# Patient Record
Sex: Female | Born: 1960 | Race: White | Hispanic: No | Marital: Single | Smoking: Former smoker
Health system: Southern US, Community
[De-identification: ages and names within clinical notes are randomized; demographics above are authoritative.]

## PROBLEM LIST (undated history)

## (undated) HISTORY — PX: OTHER SURGICAL HISTORY: SHX169

---

## 1998-01-13 ENCOUNTER — Other Ambulatory Visit: Admission: RE | Admit: 1998-01-13 | Discharge: 1998-01-13 | Payer: Self-pay | Admitting: *Deleted

## 1999-07-21 ENCOUNTER — Other Ambulatory Visit: Admission: RE | Admit: 1999-07-21 | Discharge: 1999-07-21 | Payer: Self-pay | Admitting: Obstetrics and Gynecology

## 2001-08-07 ENCOUNTER — Other Ambulatory Visit: Admission: RE | Admit: 2001-08-07 | Discharge: 2001-08-07 | Payer: Self-pay | Admitting: Obstetrics and Gynecology

## 2001-08-07 ENCOUNTER — Other Ambulatory Visit: Admission: RE | Admit: 2001-08-07 | Discharge: 2001-08-07 | Payer: Self-pay | Admitting: Obstetrics & Gynecology

## 2002-10-22 ENCOUNTER — Other Ambulatory Visit: Admission: RE | Admit: 2002-10-22 | Discharge: 2002-10-22 | Payer: Self-pay | Admitting: Obstetrics and Gynecology

## 2006-08-05 ENCOUNTER — Encounter: Admission: RE | Admit: 2006-08-05 | Discharge: 2006-08-05 | Payer: Self-pay | Admitting: Obstetrics and Gynecology

## 2007-08-18 ENCOUNTER — Encounter: Admission: RE | Admit: 2007-08-18 | Discharge: 2007-08-18 | Payer: Self-pay | Admitting: Obstetrics and Gynecology

## 2007-10-01 ENCOUNTER — Emergency Department (HOSPITAL_COMMUNITY): Admission: EM | Admit: 2007-10-01 | Discharge: 2007-10-01 | Payer: Self-pay | Admitting: Family Medicine

## 2009-09-26 ENCOUNTER — Encounter: Admission: RE | Admit: 2009-09-26 | Discharge: 2009-09-26 | Payer: Self-pay | Admitting: Obstetrics and Gynecology

## 2010-08-10 ENCOUNTER — Ambulatory Visit (HOSPITAL_BASED_OUTPATIENT_CLINIC_OR_DEPARTMENT_OTHER)
Admission: RE | Admit: 2010-08-10 | Discharge: 2010-08-10 | Disposition: A | Discharge status: Home / Self Care | Payer: BC Managed Care – PPO | Source: Ambulatory Visit | Attending: Otolaryngology | Admitting: Otolaryngology

## 2010-08-10 ENCOUNTER — Other Ambulatory Visit: Payer: Self-pay | Admitting: Otolaryngology

## 2010-08-10 DIAGNOSIS — K219 Gastro-esophageal reflux disease without esophagitis: Secondary | ICD-10-CM | POA: Insufficient documentation

## 2010-08-10 DIAGNOSIS — E669 Obesity, unspecified: Secondary | ICD-10-CM | POA: Insufficient documentation

## 2010-08-10 DIAGNOSIS — F17200 Nicotine dependence, unspecified, uncomplicated: Secondary | ICD-10-CM | POA: Insufficient documentation

## 2010-08-10 DIAGNOSIS — J383 Other diseases of vocal cords: Secondary | ICD-10-CM | POA: Insufficient documentation

## 2010-08-10 LAB — POCT HEMOGLOBIN-HEMACUE: Hemoglobin: 13.5 g/dL (ref 12.0–15.0)

## 2010-09-10 NOTE — Op Note (Signed)
  [place], [place]             ACCOUNT NO.:  [id number]  MEDICAL RECORD NO.:  [id number]  LOCATION:                                 FACILITY:  PHYSICIAN:  Shermon Bozzi H. Pollyann Kennedy, MD          DATE OF BIRTH:  DATE OF PROCEDURE:  08/10/2010 DATE OF DISCHARGE:                              OPERATIVE REPORT   PREOPERATIVE DIAGNOSIS:  Left vocal cord leukoplakia.  POSTOPERATIVE DIAGNOSIS:  Left vocal cord leukoplakia.  PROCEDURE:  Microlaryngoscopy with biopsy and laser ablation of left vocal cord leukoplakia.  SURGEON:  Myrakle Wingler H. Pollyann Kennedy, MD  ANESTHESIA:  General endotracheal anesthesia was used.  COMPLICATIONS:  None.  BLOOD LOSS:  None.  FINDINGS:  Very subtle thickening of the left true vocal cord contacting surface.  No ulceration and no bulky mass identified.  HISTORY:  A 50 year old with a history of chronic reflux and hoarseness. She was found on serial office endoscopic exams to have faint superficial leukoplakia of the left vocal cord.  Most of her symptoms have resolved with aggressive reflux treatment but the laryngoscopic findings have not.  Risks, benefits, alternatives, complications to the procedure were explained to the patient, seemed to understand and agreed to surgery.  PROCEDURE IN DETAILS:  The patient was taken to the operating room, placed on the operating room table in supine position.  Following induction of general endotracheal anesthesia, the table was turned and the patient was draped in a standard fashion.  Moist gauze was used to protect the eyes.  Wet towels were then placed around the face.  A laser Gerilyn Pilgrim laryngoscope was entered into the oral cavity, used to view the larynx and suspended the Mayo stand with the suspension apparatus.  The microscope was brought into view.  The area was inspected.  A small biopsy forceps was used to take a sample of the membranous fold mucosa on the left.  This was sent for pathologic evaluation.  The CO2 laser was  then attached to the microscope with a setting of 1 watt continuous power was used to ablate the abnormal surface mucosa.  Char was carefully suctioned off.  There was no bleeding.  There was no disruption of the muscle or ligaments.  The laser was performed using apneic technique.  The tube was then replaced.  The scope was removed. The patient was awakened, extubated and transferred to recovery room in stable condition.     Brynlie Daza H. Pollyann Kennedy, MD     JHR/MEDQ  D:  08/10/2010  T:  08/10/2010  Job:  [id number]  Electronically Signed by Serena Colonel MD on 09/10/2010 01:07:34 PM

## 2010-11-23 ENCOUNTER — Other Ambulatory Visit: Payer: Self-pay | Admitting: Obstetrics & Gynecology

## 2010-11-23 DIAGNOSIS — Z1231 Encounter for screening mammogram for malignant neoplasm of breast: Secondary | ICD-10-CM

## 2010-12-09 ENCOUNTER — Ambulatory Visit
Admission: RE | Admit: 2010-12-09 | Discharge: 2010-12-09 | Disposition: A | Discharge status: Home / Self Care | Payer: BC Managed Care – PPO | Source: Ambulatory Visit | Attending: Obstetrics & Gynecology | Admitting: Obstetrics & Gynecology

## 2010-12-09 DIAGNOSIS — Z1231 Encounter for screening mammogram for malignant neoplasm of breast: Secondary | ICD-10-CM

## 2012-02-21 ENCOUNTER — Other Ambulatory Visit: Payer: Self-pay | Admitting: Obstetrics & Gynecology

## 2012-02-21 DIAGNOSIS — Z1231 Encounter for screening mammogram for malignant neoplasm of breast: Secondary | ICD-10-CM

## 2012-03-23 ENCOUNTER — Ambulatory Visit
Admission: RE | Admit: 2012-03-23 | Discharge: 2012-03-23 | Disposition: A | Discharge status: Home / Self Care | Payer: 59 | Source: Ambulatory Visit | Attending: Obstetrics & Gynecology | Admitting: Obstetrics & Gynecology

## 2012-03-23 DIAGNOSIS — Z1231 Encounter for screening mammogram for malignant neoplasm of breast: Secondary | ICD-10-CM

## 2012-06-07 ENCOUNTER — Other Ambulatory Visit: Payer: Self-pay | Admitting: Physician Assistant

## 2012-06-07 ENCOUNTER — Ambulatory Visit
Admission: RE | Admit: 2012-06-07 | Discharge: 2012-06-07 | Disposition: A | Discharge status: Home / Self Care | Payer: 59 | Source: Ambulatory Visit | Attending: Physician Assistant | Admitting: Physician Assistant

## 2012-06-07 DIAGNOSIS — M79671 Pain in right foot: Secondary | ICD-10-CM

## 2013-04-30 ENCOUNTER — Other Ambulatory Visit: Payer: Self-pay

## 2013-04-30 DIAGNOSIS — Z1231 Encounter for screening mammogram for malignant neoplasm of breast: Secondary | ICD-10-CM

## 2013-05-11 ENCOUNTER — Encounter (INDEPENDENT_AMBULATORY_CARE_PROVIDER_SITE_OTHER): Payer: Self-pay

## 2013-05-11 ENCOUNTER — Ambulatory Visit
Admission: RE | Admit: 2013-05-11 | Discharge: 2013-05-11 | Disposition: A | Discharge status: Home / Self Care | Payer: 59 | Source: Ambulatory Visit

## 2013-05-11 DIAGNOSIS — Z1231 Encounter for screening mammogram for malignant neoplasm of breast: Secondary | ICD-10-CM

## 2014-04-11 ENCOUNTER — Other Ambulatory Visit: Payer: Self-pay

## 2014-04-11 DIAGNOSIS — Z1231 Encounter for screening mammogram for malignant neoplasm of breast: Secondary | ICD-10-CM

## 2014-06-21 ENCOUNTER — Ambulatory Visit
Admission: RE | Admit: 2014-06-21 | Discharge: 2014-06-21 | Disposition: A | Discharge status: Home / Self Care | Payer: 59 | Source: Ambulatory Visit

## 2014-06-21 DIAGNOSIS — Z1231 Encounter for screening mammogram for malignant neoplasm of breast: Secondary | ICD-10-CM

## 2014-11-08 ENCOUNTER — Ambulatory Visit
Admission: RE | Admit: 2014-11-08 | Discharge: 2014-11-08 | Disposition: A | Discharge status: Home / Self Care | Payer: 59 | Source: Ambulatory Visit | Attending: Family Medicine | Admitting: Family Medicine

## 2014-11-08 ENCOUNTER — Other Ambulatory Visit: Payer: Self-pay | Admitting: Family Medicine

## 2014-11-08 DIAGNOSIS — M542 Cervicalgia: Secondary | ICD-10-CM

## 2015-07-08 ENCOUNTER — Other Ambulatory Visit: Payer: Self-pay | Admitting: Obstetrics & Gynecology

## 2015-07-08 DIAGNOSIS — Z1231 Encounter for screening mammogram for malignant neoplasm of breast: Secondary | ICD-10-CM

## 2015-07-18 ENCOUNTER — Ambulatory Visit
Admission: RE | Admit: 2015-07-18 | Discharge: 2015-07-18 | Disposition: A | Discharge status: Home / Self Care | Payer: 59 | Source: Ambulatory Visit | Attending: Obstetrics & Gynecology | Admitting: Obstetrics & Gynecology

## 2015-07-18 DIAGNOSIS — Z1231 Encounter for screening mammogram for malignant neoplasm of breast: Secondary | ICD-10-CM

## 2016-06-16 DIAGNOSIS — F5101 Primary insomnia: Secondary | ICD-10-CM | POA: Diagnosis not present

## 2016-06-16 DIAGNOSIS — K219 Gastro-esophageal reflux disease without esophagitis: Secondary | ICD-10-CM | POA: Diagnosis not present

## 2016-08-25 ENCOUNTER — Other Ambulatory Visit: Payer: Self-pay | Admitting: Obstetrics & Gynecology

## 2016-08-25 DIAGNOSIS — Z1231 Encounter for screening mammogram for malignant neoplasm of breast: Secondary | ICD-10-CM

## 2016-09-03 ENCOUNTER — Ambulatory Visit
Admission: RE | Admit: 2016-09-03 | Discharge: 2016-09-03 | Disposition: A | Discharge status: Home / Self Care | Payer: 59 | Source: Ambulatory Visit | Attending: Obstetrics & Gynecology | Admitting: Obstetrics & Gynecology

## 2016-09-03 DIAGNOSIS — Z1231 Encounter for screening mammogram for malignant neoplasm of breast: Secondary | ICD-10-CM | POA: Diagnosis not present

## 2016-10-13 DIAGNOSIS — K5730 Diverticulosis of large intestine without perforation or abscess without bleeding: Secondary | ICD-10-CM | POA: Diagnosis not present

## 2016-10-13 DIAGNOSIS — Z1211 Encounter for screening for malignant neoplasm of colon: Secondary | ICD-10-CM | POA: Diagnosis not present

## 2016-10-13 DIAGNOSIS — K648 Other hemorrhoids: Secondary | ICD-10-CM | POA: Diagnosis not present

## 2016-10-18 DIAGNOSIS — Z23 Encounter for immunization: Secondary | ICD-10-CM | POA: Diagnosis not present

## 2016-10-18 DIAGNOSIS — Z1159 Encounter for screening for other viral diseases: Secondary | ICD-10-CM | POA: Diagnosis not present

## 2016-10-18 DIAGNOSIS — Z0000 Encounter for general adult medical examination without abnormal findings: Secondary | ICD-10-CM | POA: Diagnosis not present

## 2016-11-05 DIAGNOSIS — D2261 Melanocytic nevi of right upper limb, including shoulder: Secondary | ICD-10-CM | POA: Diagnosis not present

## 2016-11-05 DIAGNOSIS — D2222 Melanocytic nevi of left ear and external auricular canal: Secondary | ICD-10-CM | POA: Diagnosis not present

## 2016-11-05 DIAGNOSIS — L821 Other seborrheic keratosis: Secondary | ICD-10-CM | POA: Diagnosis not present

## 2016-11-26 DIAGNOSIS — Z01419 Encounter for gynecological examination (general) (routine) without abnormal findings: Secondary | ICD-10-CM | POA: Diagnosis not present

## 2017-08-22 ENCOUNTER — Other Ambulatory Visit: Payer: Self-pay | Admitting: Obstetrics & Gynecology

## 2017-08-22 DIAGNOSIS — Z1231 Encounter for screening mammogram for malignant neoplasm of breast: Secondary | ICD-10-CM

## 2017-10-03 DIAGNOSIS — K219 Gastro-esophageal reflux disease without esophagitis: Secondary | ICD-10-CM | POA: Diagnosis not present

## 2017-10-03 DIAGNOSIS — Z23 Encounter for immunization: Secondary | ICD-10-CM | POA: Diagnosis not present

## 2017-10-03 DIAGNOSIS — Z91018 Allergy to other foods: Secondary | ICD-10-CM | POA: Diagnosis not present

## 2017-11-07 ENCOUNTER — Ambulatory Visit
Admission: RE | Admit: 2017-11-07 | Discharge: 2017-11-07 | Disposition: A | Discharge status: Home / Self Care | Payer: 59 | Source: Ambulatory Visit | Attending: Obstetrics & Gynecology | Admitting: Obstetrics & Gynecology

## 2017-11-07 ENCOUNTER — Encounter: Payer: Self-pay | Admitting: Radiology

## 2017-11-07 DIAGNOSIS — Z1231 Encounter for screening mammogram for malignant neoplasm of breast: Secondary | ICD-10-CM

## 2018-05-30 IMAGING — MG DIGITAL SCREENING BILATERAL MAMMOGRAM WITH CAD
4 series · 4 of 4 positions shown · non-contrast
Comparison: Previous exam(s).

CLINICAL DATA: Screening.

EXAM:
DIGITAL SCREENING BILATERAL MAMMOGRAM WITH CAD

[R MLO]
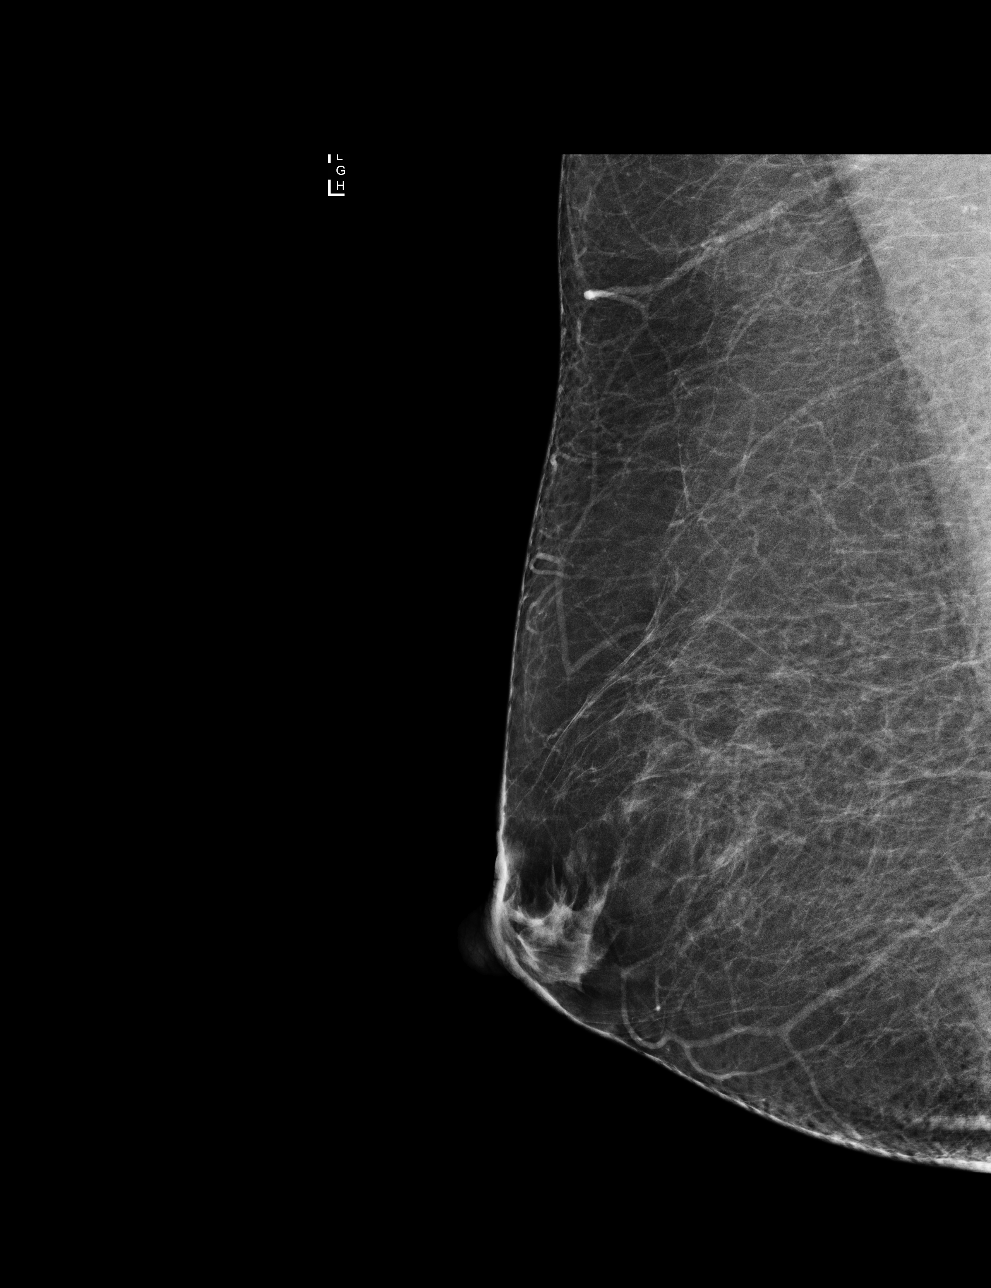

[R CC]
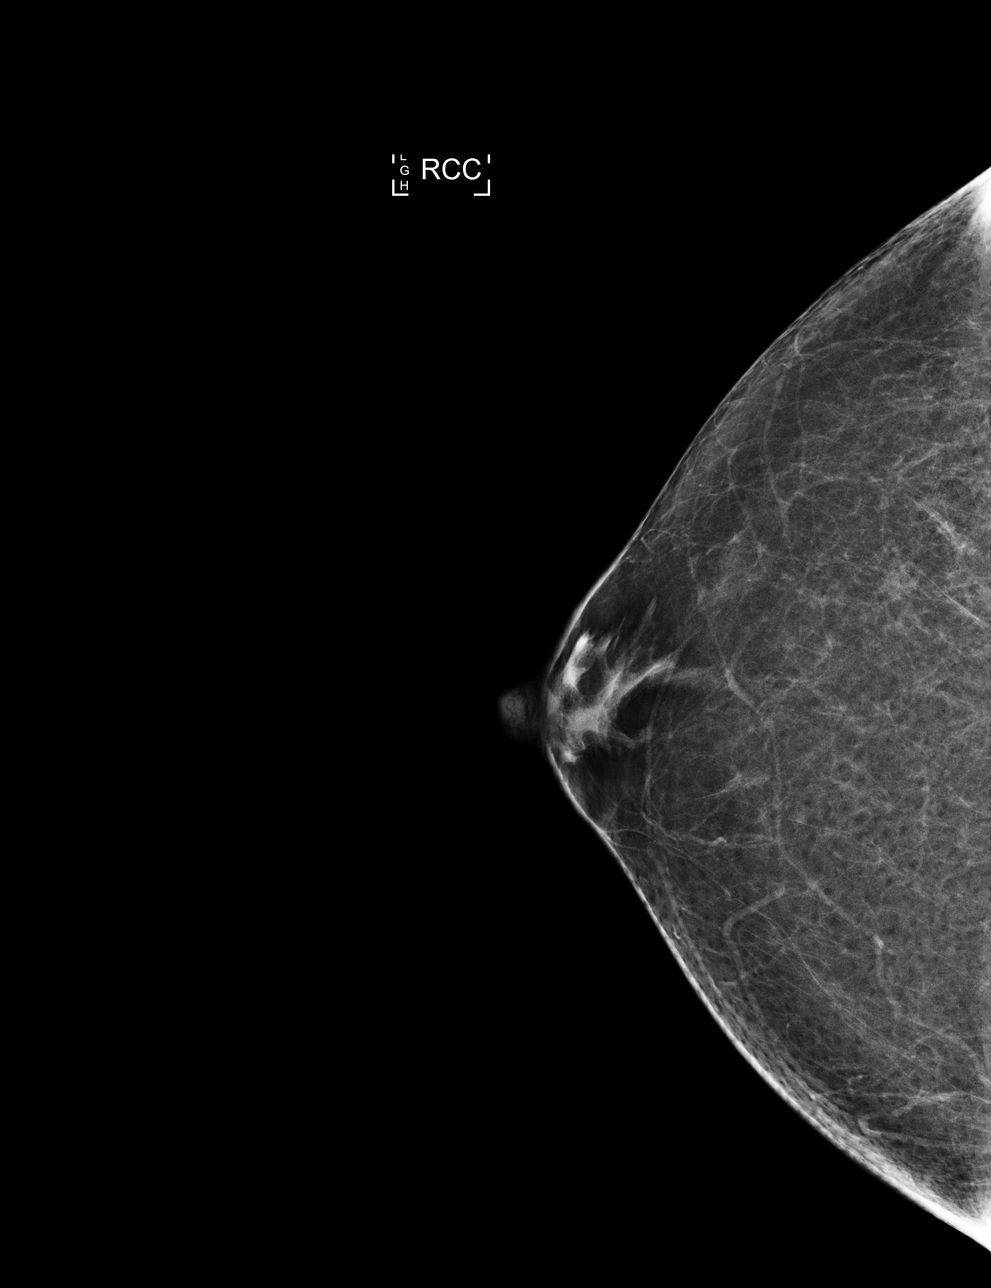

[L MLO]
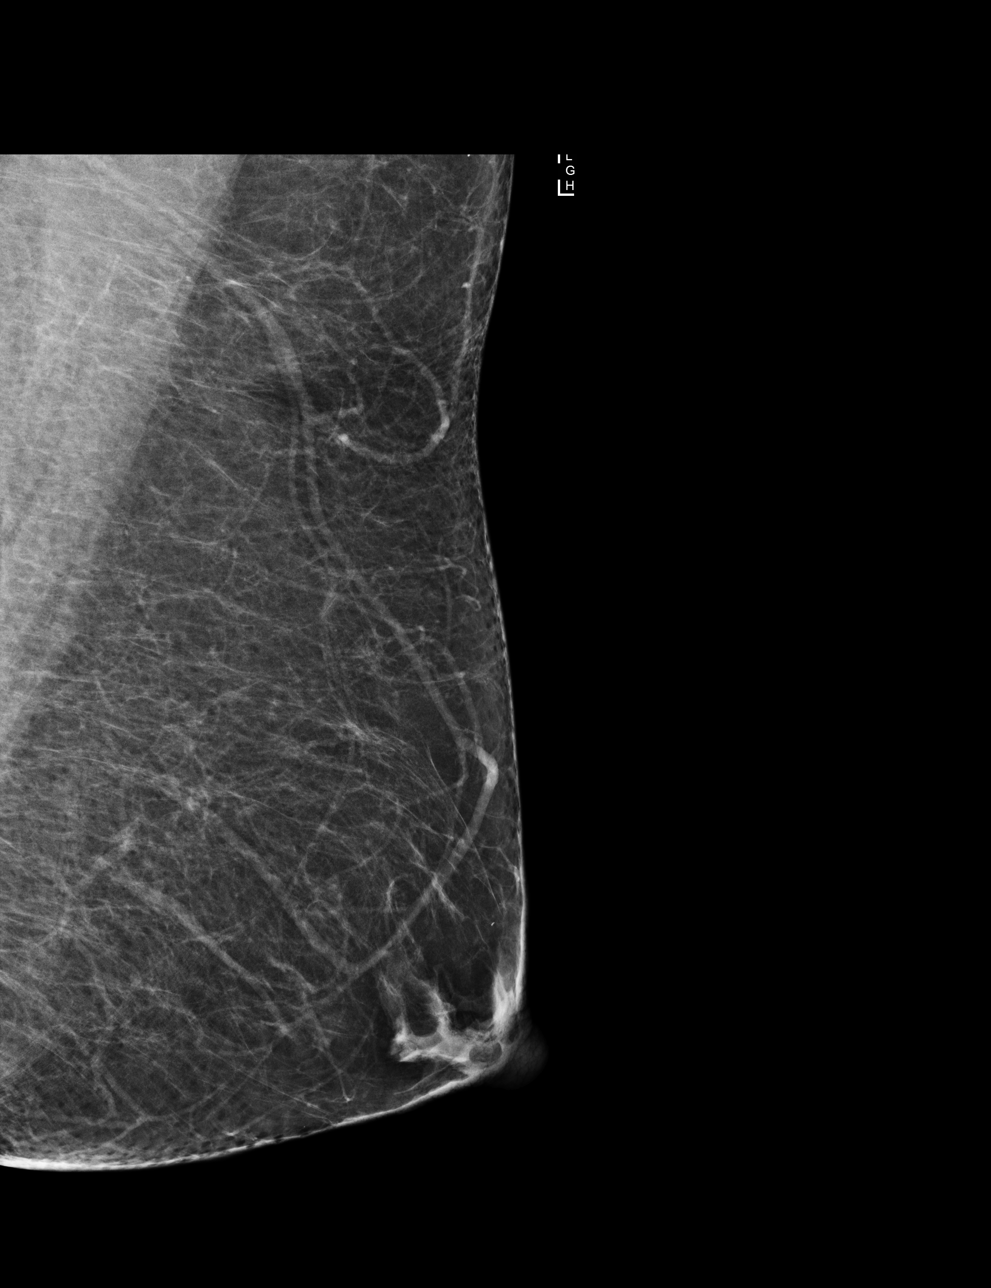

[L CC]
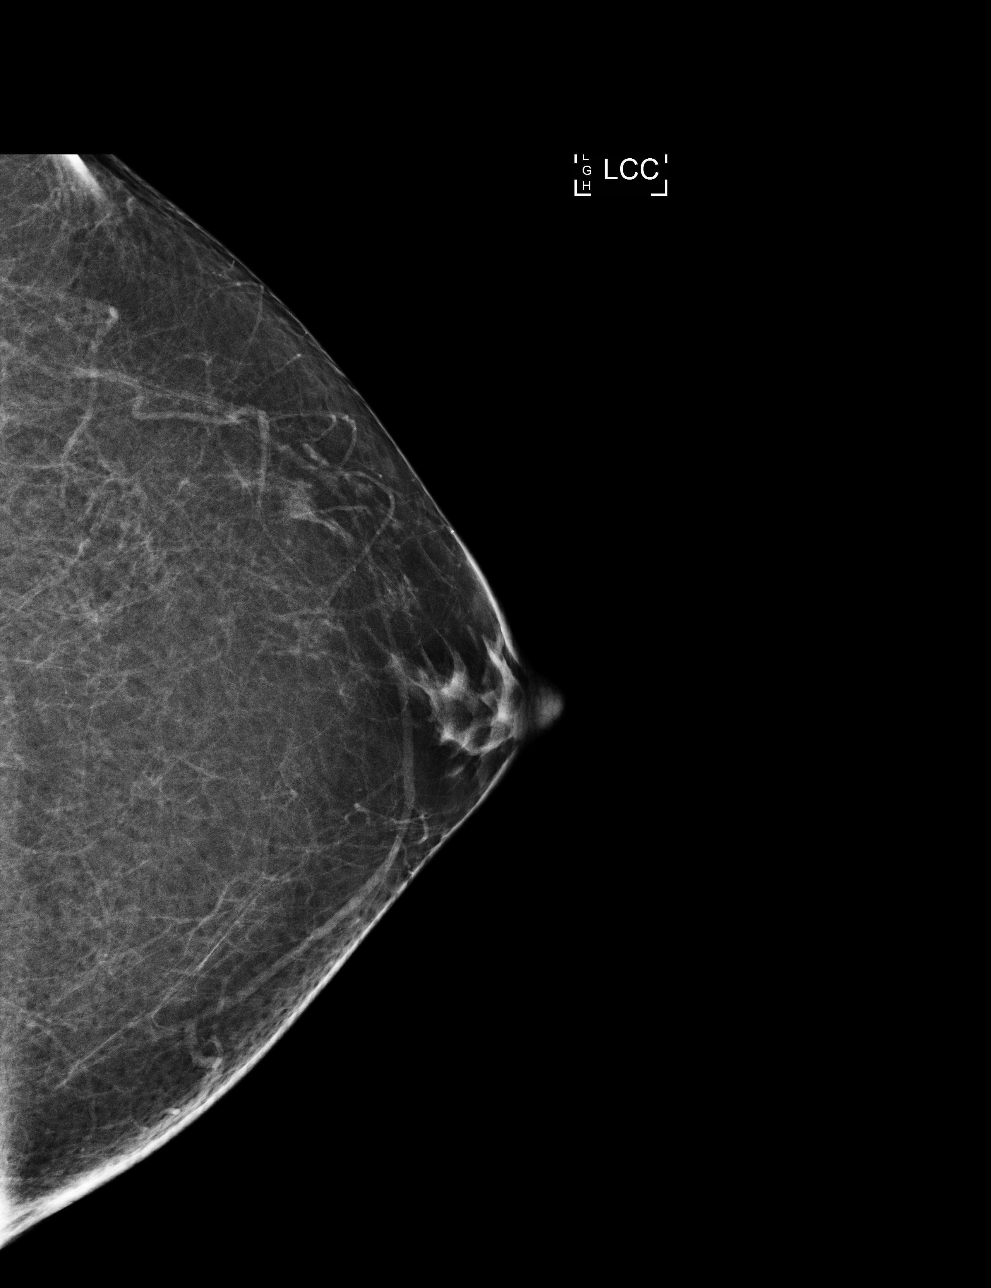

[4 of 4 positions shown; findings below may reference images not displayed]

ACR Breast Density Category b: There are scattered areas of
fibroglandular density.
FINDINGS: There are no findings suspicious for malignancy. Images were
processed with CAD.
IMPRESSION: No mammographic evidence of malignancy. A result letter of this
screening mammogram will be mailed directly to the patient.

RECOMMENDATION:
Screening mammogram in one year. (Code:AS-G-LCT)

BI-RADS CATEGORY  1: Negative.

## 2019-04-03 ENCOUNTER — Other Ambulatory Visit: Payer: Self-pay | Admitting: Obstetrics & Gynecology

## 2019-04-03 DIAGNOSIS — Z1231 Encounter for screening mammogram for malignant neoplasm of breast: Secondary | ICD-10-CM

## 2019-07-13 ENCOUNTER — Other Ambulatory Visit: Payer: Self-pay

## 2019-07-13 ENCOUNTER — Ambulatory Visit
Admission: RE | Admit: 2019-07-13 | Discharge: 2019-07-13 | Disposition: A | Discharge status: Home / Self Care | Payer: 59 | Source: Ambulatory Visit | Attending: Obstetrics & Gynecology | Admitting: Obstetrics & Gynecology

## 2019-07-13 DIAGNOSIS — Z1231 Encounter for screening mammogram for malignant neoplasm of breast: Secondary | ICD-10-CM

## 2019-11-20 ENCOUNTER — Encounter: Payer: Self-pay | Admitting: Allergy and Immunology

## 2019-11-20 ENCOUNTER — Other Ambulatory Visit: Payer: Self-pay

## 2019-11-20 ENCOUNTER — Ambulatory Visit (INDEPENDENT_AMBULATORY_CARE_PROVIDER_SITE_OTHER): Payer: BC Managed Care – PPO | Admitting: Allergy and Immunology

## 2019-11-20 VITALS — BP 110/60 | HR 86 | Temp 98.1°F | Resp 16 | Ht 66.5 in | Wt 217.0 lb

## 2019-11-20 DIAGNOSIS — H04123 Dry eye syndrome of bilateral lacrimal glands: Secondary | ICD-10-CM

## 2019-11-20 DIAGNOSIS — L253 Unspecified contact dermatitis due to other chemical products: Secondary | ICD-10-CM | POA: Diagnosis not present

## 2019-11-20 DIAGNOSIS — Z720 Tobacco use: Secondary | ICD-10-CM

## 2019-11-20 DIAGNOSIS — L989 Disorder of the skin and subcutaneous tissue, unspecified: Secondary | ICD-10-CM

## 2019-11-20 DIAGNOSIS — Z91018 Allergy to other foods: Secondary | ICD-10-CM

## 2019-11-20 MED ORDER — PIMECROLIMUS 1 % EX CREA: TOPICAL_CREAM | Freq: Two times a day (BID) | CUTANEOUS | 0 refills | Status: DC

## 2019-11-20 NOTE — Patient Instructions (Addendum)
  1.  Patch test today and 48-hour reviewed on Thursday  2.  Do not use any topical steroids on face or trunk  3.  Start Elidel applied to face twice a day  4.  Can use Vaseline-based moisturizer especially after exposure to water  5.  Can use OTC Systane eyedrops for moisturization  6.  Blood -ANA with reflex to ENA  7.  Eliminate vape exposure and use nicotine substitute  8.  Can use Zyrtec 10 mg 1 time per day (dry eye???)  9.  EpiPen if needed  10.  Return to clinic in 4 weeks or earlier if problem

## 2019-11-20 NOTE — Progress Notes (Signed)
[place] - [place] - [place] - [place] - [place]   Dear Dr. Kenton Kingfisher,  Thank you for referring Grace Hunt to the [facility] [place] on 11/20/2019.   Below is a summation of this patient's evaluation and recommendations.  Thank you for your referral. I will keep you informed about this patient's response to treatment.   If you have any questions please do not hesitate to contact me.   Sincerely,  Jiles Prows, MD Allergy / Immunology [place] [facility]   ______________________________________________________________________    NEW PATIENT NOTE  Referring Provider: Shirline Frees, MD Primary Provider: Shirline Frees, MD Date of office visit: 11/20/2019    Subjective:   Chief Complaint:  Grace Hunt (DOB: Apr 08, 1960) is a 59 y.o. female who presents to the clinic on 11/20/2019 with a chief complaint of Rash .     HPI: Grace Hunt presents to this clinic in evaluation of dermatitis.  Apparently in March 2021 she started to develop a red area on her face which quickly progressed to involve her entire face especially her periorbital region and her anterior neck and upper trunk.  This was intensely itchy and was associated with redness and scaling.  She has seen a dermatologist on several occasions and has been treated with 2 courses of systemic steroids.  With each course of systemic steroid she responded with resolution of her rash only for this dermatitis to return after completing her prednisone dose.  She has been treated with desonide and hydrocortisone and triamcinolone.  At this point most of her truncal dermatitis has resolved and she is left mostly with facial dermatitis once again especially her periorbital region.  There is no obvious provoking factor giving rise to this issue.  She has not really had a significant environmental change although she has been changing her brand  of vaping material.  She was utilizing several supplements but discontinued these agents for 5 months without any effect on her dermatitis.  She has not started any new medications that could account for this issue.  She has a history of very significant dry eye that was treated with Restasis many years ago.  She has never been told that she may have Sjogren's.  She does have a history of "hayfever" occurring on a perennial basis for which she takes Zyrtec which works pretty well.  She has a history of tree nut allergy manifested as facial swelling and global flushing in 1993 for which she has an EpiPen.  She has received 2 Covid vaccines and a flu vaccine this year.  History reviewed. No pertinent past medical history.  Past Surgical History:  Procedure Laterality Date  . vocal cord surgery      Allergies as of 11/20/2019   No Known Allergies     Medication List      ALPRAZolam 0.5 MG tablet Commonly known as: XANAX Take 0.25 mg by mouth daily as needed.   CALTRATE 600 PO Take by mouth.   CENTRUM WOMEN PO Take by mouth.   cetirizine 10 MG tablet Commonly known as: ZYRTEC Take 10 mg by mouth daily.   EPINEPHrine 0.3 mg/0.3 mL Soaj injection Commonly known as: EPI-PEN Inject 0.3 mg into the muscle as needed for anaphylaxis.   eszopiclone 2 MG Tabs tablet Commonly known as: LUNESTA Take 2 mg by mouth at bedtime as needed.   loratadine 10 MG tablet Commonly known as: CLARITIN Take 10 mg  by mouth daily.   OMEGA 3 500 PO Take by mouth.   omeprazole 20 MG capsule Commonly known as: PRILOSEC Take 20 mg by mouth daily.   UNABLE TO FIND Med Name: apple cider vinegar       Review of systems negative except as noted in HPI / PMHx or noted below:  Review of Systems  Constitutional: Negative.   HENT: Negative.   Eyes: Negative.   Respiratory: Negative.   Cardiovascular: Negative.   Gastrointestinal: Negative.   Genitourinary: Negative.   Musculoskeletal:  Negative.   Skin: Negative.   Neurological: Negative.   Endo/Heme/Allergies: Negative.   Psychiatric/Behavioral: Negative.     Family History  Problem Relation Age of Onset  . Allergic rhinitis Neg Hx   . Angioedema Neg Hx   . Asthma Neg Hx   . Eczema Neg Hx   . Immunodeficiency Neg Hx   . Urticaria Neg Hx     Social History   Socioeconomic History  . Marital status: Single    Spouse name: Not on file  . Number of children: Not on file  . Years of education: Not on file  . Highest education level: Not on file  Occupational History  . Not on file  Tobacco Use  . Smoking status: Former Smoker    Types: Cigarettes    Quit date: 01/18/2009    Years since quitting: 10.8  . Smokeless tobacco: Never Used  Vaping Use  . Vaping Use: Every day  Substance and Sexual Activity  . Alcohol use: Yes    Comment: occ  . Drug use: Never  . Sexual activity: Not on file  Other Topics Concern  . Not on file  Social History Narrative  . 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  Objective:   Vitals:   11/20/19 0935  BP: 110/60  Pulse: 86  Resp: 16  Temp: 98.1 F (36.7 C)  SpO2: 98%   Height: 5' 6.5" (168.9 cm) Weight: 217 lb (98.4 kg)  Physical Exam Constitutional:      Appearance: She is not diaphoretic.  HENT:     Head: Normocephalic.     Right Ear: Tympanic membrane, ear canal and external ear normal.     Left Ear: Tympanic membrane, ear canal and external ear normal.     Nose: Nose normal. No mucosal edema or rhinorrhea.     Mouth/Throat:     Pharynx: Uvula midline. No oropharyngeal exudate.  Eyes:     Conjunctiva/sclera: Conjunctivae normal.  Neck:     Thyroid: No thyromegaly.     Trachea: Trachea normal. No tracheal  tenderness or tracheal deviation.  Cardiovascular:     Rate and Rhythm: Normal rate and regular rhythm.     Heart sounds: Normal heart sounds, S1 normal and S2 normal. No murmur heard.   Pulmonary:     Effort: No respiratory distress.     Breath sounds: Normal breath sounds. No stridor. No wheezing or rales.  Lymphadenopathy:     Head:     Right side of head: No tonsillar adenopathy.     Left side of head: No tonsillar adenopathy.     Cervical: No cervical adenopathy.  Skin:    Findings: Rash (Patchy erythematous indurated  slightly scaly dermatitis involving entire face and periorbital region and anterior chest.) present. No erythema.     Nails: There is no clubbing.  Neurological:     Mental Status: She is alert.     Diagnostics: A true test patch test with 36 allergens / chemicals was placed on her back  Assessment and Plan:    1. Inflammatory dermatosis   2. Contact dermatitis due to chemicals   3. Dry eye syndrome of both eyes   4. Tree nut allergy   5. Vapes nicotine containing substance     1.  Patch test today and 48-hour reviewed on Thursday  2.  Do not use any topical steroids on face or trunk  3.  Start Elidel applied to face twice a day  4.  Can use Vaseline-based moisturizer especially after exposure to water  5.  Can use OTC Systane eyedrops for moisturization  6.  Blood -ANA with reflex to ENA  7.  Eliminate vape exposure and use nicotine substitute  8.  Can use Zyrtec 10 mg 1 time per day (dry eye???)  9.  EpiPen if needed  10.  Return to clinic in 4 weeks or earlier if problem  Grace Hunt has a very significant inflammatory dermatosis involving her face and we will investigate this issue further with a patch test to see if she is developing some form of contact dermatitis and I have eliminated the use of all of her topical steroid use as she may have developed a sensitivity to this contact and we will have her use a calcineurin inhibitor on a consistent  basis at this point.  I have asked her to eliminate her vape exposure as this may be responsible for some of her contact dermatosis.  As well, given her dry eye syndrome we will check an ANA with reflex to ENA and investigation of possible sicca syndrome with a cutaneous component.  She will return to this clinic in 48 hours for her initial patch test read.  Jiles Prows, MD Allergy / Immunology [place] [facility] [place]

## 2019-11-21 ENCOUNTER — Encounter: Payer: BC Managed Care – PPO | Admitting: Allergy

## 2019-11-21 ENCOUNTER — Encounter: Payer: Self-pay | Admitting: Allergy and Immunology

## 2019-11-21 LAB — ANA W/REFLEX: Anti Nuclear Antibody (ANA): NEGATIVE

## 2019-11-22 ENCOUNTER — Encounter: Payer: Self-pay | Admitting: Allergy

## 2019-11-22 ENCOUNTER — Other Ambulatory Visit: Payer: Self-pay

## 2019-11-22 ENCOUNTER — Ambulatory Visit: Payer: BC Managed Care – PPO | Admitting: Allergy

## 2019-11-22 DIAGNOSIS — L253 Unspecified contact dermatitis due to other chemical products: Secondary | ICD-10-CM

## 2019-11-22 NOTE — Patient Instructions (Signed)
Return for final read on Monday.  Take pictures of the back on Saturday.  Do not put anything directly on the back and minimize getting the back wet.  Initial handouts given.  Will email safe list on Monday.

## 2019-11-22 NOTE — Progress Notes (Signed)
   Follow Up Note  RE: NOLAN LASSER MRN: [id number] DOB: 31-Jul-1960 Date of Office Visit: 11/22/2019  Referring provider: Shirline Frees, MD Primary care provider: Shirline Frees, MD  History of Present Illness: I had the pleasure of seeing Shaelyn Decarli for a follow up visit at the [facility] [place] [facility] [place] on 11/22/2019. She is a 59 y.o. female, who is being followed for dermatitis. Today she is here for initial patch test interpretation, given suspected history of contact dermatitis.   Diagnostics:  TRUE TEST 48 hour reading:   T.R.U.E. Test - 11/22/19 1200    Reading Interval Day 3    1. Nickel Sulfate 2    2. Wool Alcohols 0    3. Neomycin Sulfate 0    4. Potassium Dichromate 0    5. Caine Mix 0    6. Fragrance Mix 0    7. Colophony 1    8. Paraben Mix 0    9. Negative Control 0    10. Balsam of [place] 0    11. Ethylenediamine Dihydrochloride 0    12. Cobalt Dichloride 0    13. p-tert Butylphenol Formaldehyde Resin 0    14. Epoxy Resin 1    15. Carba Mix 0    16.  Black Rubber Mix 0    17. Cl+ Me-Isothiazolinone 0    18. Quaternium-15 0    19. Methyldibromo Glutaronitrile 0    20. p-Phenylenediamine 0    21. Formaldehyde 2    22. Mercapto Mix 0    23. Thimerosal 0    24. Thiuram Mix 0    25. Diazolidinyl Urea 0    26. Quinoline Mix 0    27. Tixocortol-21-Pivalate 0    28. Gold Sodium Thiosulfate 2    29. Imidazolidinyl Urea 0    30. Budesonide 0    31. Hydrocortisone-17-Butyrate 0    32. Mercaptobenzothiazole 0    33. Bacitracin 0    34. Parthenolide 0    35. Disperse Blue 106 0    36. 2-Bromo-2-Nitropropane-1,3-diol 0            Assessment and Plan: Taryne is a 59 y.o. female with: Contact dermatitis due to chemicals  Removed patches.  48 hour read showed multiple positives as above - gave handouts.   Return for final read on Monday and will email safe list then.    Take pictures of the back on Saturday.  Do not put anything  directly on the back and minimize getting the back wet.   Return in about 4 days (around 11/26/2019).  It was my pleasure to see Yarelis today and participate in her care. Please feel free to contact me with any questions or concerns.  Sincerely,  Rexene Alberts, DO Allergy & Immunology  [facility] office: [phone] [facility] office: [place] office: [phone]

## 2019-11-22 NOTE — Assessment & Plan Note (Addendum)
   Removed patches.  48 hour read showed multiple positives as above - gave handouts.   Return for final read on Monday and will email safe list then.    Take pictures of the back on Saturday.  Do not put anything directly on the back and minimize getting the back wet.

## 2019-11-26 ENCOUNTER — Ambulatory Visit (INDEPENDENT_AMBULATORY_CARE_PROVIDER_SITE_OTHER): Payer: BC Managed Care – PPO | Admitting: Allergy

## 2019-11-26 ENCOUNTER — Other Ambulatory Visit: Payer: Self-pay

## 2019-11-26 ENCOUNTER — Encounter: Payer: Self-pay | Admitting: Allergy

## 2019-11-26 DIAGNOSIS — L253 Unspecified contact dermatitis due to other chemical products: Secondary | ICD-10-CM

## 2019-11-26 NOTE — Patient Instructions (Addendum)
Will send you a pdf file of items that are safe for you to use.  Please only use items that is on your safe list. Check if the prescription topical creams are on the safe list as well. See handouts  Continue proper skin care.  Patch testing positive to: Nickel, neomycin sulfate, colophony, epoxy resin, formaldehyde, gold, bacitracin.  Follow up with Dr. Neldon Mc as scheduled.

## 2019-11-26 NOTE — Assessment & Plan Note (Addendum)
   TRUE Patch testing positive to: Nickel, neomycin sulfate, colophony, epoxy resin, formaldehyde, gold, bacitracin.  The patient has been provided detailed information regarding the substances she is sensitive to, as well as products containing the substances.  Meticulous avoidance of these substances is recommended.  Safe list emailed to patient.   Advised patient to only use items from the safe list and to make sure the prescription topical creams are on the safe list as well.   Continue proper skin care.

## 2019-11-26 NOTE — Progress Notes (Signed)
Follow Up Note  RE: Grace Hunt MRN: [id number] DOB: 26-Jun-1960 Date of Office Visit: 11/26/2019  Referring provider: Shirline Frees, MD Primary care provider: Shirline Frees, MD  History of Present Illness: I had the pleasure of seeing Grace Hunt for a follow up visit at the [facility] [place] [facility] [place] on 11/26/2019. She is a 59 y.o. female, who is being followed for dermatitis. Today she is here for final patch test interpretation, given suspected history of contact dermatitis.   Diagnostics:  TRUE TEST final read - 6 day post placement.  T.R.U.E. Test - 11/26/19 1000    Reading Interval Day 5    1. Nickel Sulfate 2    2. Wool Alcohols 0    3. Neomycin Sulfate 2    4. Potassium Dichromate 0    5. Caine Mix 0    6. Fragrance Mix 0    7. Colophony 1    8. Paraben Mix 0    9. Negative Control 0    10. Balsam of [place] 0    11. Ethylenediamine Dihydrochloride 0    12. Cobalt Dichloride 0    13. p-tert Butylphenol Formaldehyde Resin 0    14. Epoxy Resin 1    15. Carba Mix 0    16.  Black Rubber Mix 0    17. Cl+ Me-Isothiazolinone 0    18. Quaternium-15 0    19. Methyldibromo Glutaronitrile 0    20. p-Phenylenediamine 0    21. Formaldehyde --   +/-   22. Mercapto Mix 0    23. Thimerosal 0    24. Thiuram Mix 0    25. Diazolidinyl Urea 0    26. Quinoline Mix 0    27. Tixocortol-21-Pivalate 0    28. Gold Sodium Thiosulfate 2    29. Imidazolidinyl Urea 0    30. Budesonide 0    31. Hydrocortisone-17-Butyrate 0    32. Mercaptobenzothiazole 0    33. Bacitracin 2    34. Parthenolide 0    35. Disperse Blue 106 0    36. 2-Bromo-2-Nitropropane-1,3-diol 0            Assessment and Plan: Grace Hunt is a 59 y.o. female with: Contact dermatitis due to chemicals  TRUE Patch testing positive to: Nickel, neomycin sulfate, colophony, epoxy resin, formaldehyde, gold, bacitracin.  The patient has been provided detailed information regarding the substances she is  sensitive to, as well as products containing the substances.  Meticulous avoidance of these substances is recommended.  Safe list emailed to patient.   Advised patient to only use items from the safe list and to make sure the prescription topical creams are on the safe list as well.   Continue proper skin care.  Return in about 4 weeks (around 12/24/2019).  Medication List:  Current Outpatient Medications  Medication Sig Dispense Refill  . ALPRAZolam (XANAX) 0.5 MG tablet Take 0.25 mg by mouth daily as needed.    . Calcium Carbonate (CALTRATE 600 PO) Take by mouth.    . cetirizine (ZYRTEC) 10 MG tablet Take 10 mg by mouth daily.    Marland Kitchen EPINEPHrine 0.3 mg/0.3 mL IJ SOAJ injection Inject 0.3 mg into the muscle as needed for anaphylaxis.    Marland Kitchen eszopiclone (LUNESTA) 2 MG TABS tablet Take 2 mg by mouth at bedtime as needed.    . loratadine (CLARITIN) 10 MG tablet Take 10 mg by mouth daily.    . Multiple Vitamins-Minerals (CENTRUM WOMEN PO) Take by mouth.    Marland Kitchen  Omega-3 Fatty Acids (OMEGA 3 500 PO) Take by mouth.    Marland Kitchen omeprazole (PRILOSEC) 20 MG capsule Take 20 mg by mouth daily.    . pimecrolimus (ELIDEL) 1 % cream Apply topically 2 (two) times daily. 30 g 0  . UNABLE TO FIND Med Name: apple cider vinegar     No current facility-administered medications for this visit.   Allergies: No Known Allergies I reviewed her past medical history, social history, family history, and environmental history and no significant changes have been reported from her previous visit.  Review of Systems  Skin: Positive for rash.   Objective: LMP 03/03/2012  There is no height or weight on file to calculate BMI. Physical Exam Vitals and nursing note reviewed.  Constitutional:      Appearance: Normal appearance.  Skin:    Findings: Rash present.     Comments: Erythematous hue on the face and neck.  Neurological:     Mental Status: She is alert.    Previous notes and tests were reviewed. The plan was  reviewed with the patient/family, and all questions/concerned were addressed.  It was my pleasure to see Grace Hunt today and participate in her care. Please feel free to contact me with any questions or concerns.  Sincerely,  Rexene Alberts, DO Allergy & Immunology  [facility] office: [phone] [facility] office: [place] office: [phone]

## 2019-11-27 ENCOUNTER — Encounter: Payer: BC Managed Care – PPO | Admitting: Allergy & Immunology

## 2019-12-07 ENCOUNTER — Ambulatory Visit: Payer: BC Managed Care – PPO

## 2019-12-18 ENCOUNTER — Ambulatory Visit (INDEPENDENT_AMBULATORY_CARE_PROVIDER_SITE_OTHER): Payer: BC Managed Care – PPO | Admitting: Allergy and Immunology

## 2019-12-18 ENCOUNTER — Other Ambulatory Visit: Payer: Self-pay

## 2019-12-18 ENCOUNTER — Other Ambulatory Visit: Payer: Self-pay | Admitting: Allergy and Immunology

## 2019-12-18 ENCOUNTER — Encounter: Payer: Self-pay | Admitting: Allergy and Immunology

## 2019-12-18 VITALS — BP 128/92 | HR 86 | Temp 97.2°F | Resp 16

## 2019-12-18 DIAGNOSIS — L989 Disorder of the skin and subcutaneous tissue, unspecified: Secondary | ICD-10-CM

## 2019-12-18 DIAGNOSIS — H04123 Dry eye syndrome of bilateral lacrimal glands: Secondary | ICD-10-CM | POA: Diagnosis not present

## 2019-12-18 DIAGNOSIS — L253 Unspecified contact dermatitis due to other chemical products: Secondary | ICD-10-CM | POA: Diagnosis not present

## 2019-12-18 DIAGNOSIS — Z91018 Allergy to other foods: Secondary | ICD-10-CM | POA: Diagnosis not present

## 2019-12-18 MED ORDER — EPINEPHRINE 0.3 MG/0.3ML IJ SOAJ: 0.3000 mg | INTRAMUSCULAR | 1 refills | Status: DC | PRN

## 2019-12-18 MED ORDER — PIMECROLIMUS 1 % EX CREA
TOPICAL_CREAM | Freq: Two times a day (BID) | CUTANEOUS | 5 refills | Status: DC
Start: 2019-12-18 — End: 2020-03-07

## 2019-12-18 MED ORDER — METRONIDAZOLE 0.75 % EX CREA: TOPICAL_CREAM | Freq: Two times a day (BID) | CUTANEOUS | 5 refills | Status: DC | PRN

## 2019-12-18 NOTE — Progress Notes (Signed)
[name]   Follow-up Note  Referring Provider: Shirline Frees, MD Primary Provider: Shirline Frees, MD Date of Office Visit: 12/18/2019  Subjective:   Grace Hunt (DOB: [birth year]) is a 59 y.o. female who returns to the Allergy and [place] on 12/18/2019 in re-evaluation of the following:  HPI: Grace Hunt returns to this clinic for evaluation of a facial dermatitis and a history of allergic rhinitis, dry eye syndrome, and tree nut allergy.  I last saw her in this clinic on 20 November 2019 at which point in time we applied a true test patch test which identified sensitivity to nickel, neomycin, colophony, epoxy resin, formaldehyde, gold, and bacitracin.  While consistently using Elidel about 1 time per day and avoiding all of her possible sensitizers she has really done very well.  She is very close to normal regarding the skin on her face although her neck still occasionally has some redness.  She has been able to eliminate all antihistamine use.  Allergies as of 12/18/2019   No Known Allergies     Medication List    ALPRAZolam 0.5 MG tablet Commonly known as: XANAX Take 0.25 mg by mouth daily as needed.   CALTRATE 600 PO Take by mouth.   CENTRUM WOMEN PO Take by mouth.   EPINEPHrine 0.3 mg/0.3 mL Soaj injection Commonly known as: EPI-PEN Inject 0.3 mg into the muscle as needed for anaphylaxis.   eszopiclone 2 MG Tabs tablet Commonly known as: LUNESTA Take 2 mg by mouth at bedtime as needed.   OMEGA 3 500 PO Take by mouth.   omeprazole 20 MG capsule Commonly known as: PRILOSEC Take 20 mg by mouth daily.   pimecrolimus 1 % cream Commonly known as: Elidel Apply topically 2 (two) times daily.   UNABLE TO FIND Med Name: apple cider vinegar       History reviewed. No pertinent past medical history.  Past Surgical History:  Procedure Laterality Date  . vocal cord surgery      Review of systems  negative except as noted in HPI / PMHx or noted below:  Review of Systems  Constitutional: Negative.   HENT: Negative.   Eyes: Negative.   Respiratory: Negative.   Cardiovascular: Negative.   Gastrointestinal: Negative.   Genitourinary: Negative.   Musculoskeletal: Negative.   Skin: Negative.   Neurological: Negative.   Endo/Heme/Allergies: Negative.   Psychiatric/Behavioral: Negative.      Objective:   Vitals:   12/18/19 1029  BP: (!) 128/92  Pulse: 86  Resp: 16  Temp: (!) 97.2 F (36.2 C)  SpO2: 99%           Physical Exam Skin:    Findings: Rash (Diffuse erythema of cheeks and chin without any induration.  Several small cystic lesions.  Slight erythema of anterior neck.) present.     Diagnostics:    Results of blood tests obtained 20 November 2019 identified negative ANA.  Assessment and Plan:   1. Inflammatory dermatosis   2. Contact dermatitis due to chemicals   3. Dry eye syndrome of both eyes   4. Tree nut allergy     1.  Continue Elidel applied to face 1-7 times per week  2.  Can add metrocream to skin 1-2 times per day  3.  Can use OTC Systane eyedrops for moisturization if needed  4.  Can use EpiPen if needed  5.  Return to clinic in 6 months or earlier if  problem  Grace Hunt will find a dose of Elidel and MetroCream that works best to maintain and control any inflammation of the skin on her face and neck.  I am not entirely sure exactly what dosing she will require to maintain good control but she can work through that issue over the course of the next several months.  I will see her back in this clinic in 6 months or earlier if there is a problem.  Allena Katz, MD Allergy / Immunology [place]

## 2019-12-18 NOTE — Patient Instructions (Addendum)
  1.  Continue Elidel applied to face 1-7 times per week  2. Can add metrocream to skin 1-2 times per day  3.  Can use OTC Systane eyedrops for moisturization if needed  4.  Can use EpiPen if needed  5.  Return to clinic in 6 months or earlier if problem

## 2019-12-19 ENCOUNTER — Encounter: Payer: Self-pay | Admitting: Allergy and Immunology

## 2020-02-06 ENCOUNTER — Telehealth: Payer: Self-pay | Admitting: Allergy and Immunology

## 2020-02-06 NOTE — Telephone Encounter (Signed)
Called and spoke to patient and she informed me that her rash has come back and she is currently experiencing tenderness, eye swelling, and hives. Patient says this came back over the weekend and she believes she needs to be tested to see if she is allergic to propylene glycol. She can not pinpoint what she was doing when the reaction began but she is away that the ingredients of her vaping liquid contains the propylene glycol.

## 2020-02-06 NOTE — Telephone Encounter (Signed)
I called Grace Hunt this morning after she left me a voicemail needing prices on her upcoming drug challenge with [place].  She needs testing for propylene glycol not polyglycol that was listed on the appt notes.   Since March of 2021 she has changed products (shampoos etc) to products that is on her "safe list" but she is not having any relief.   She does vape which she states propylene glycol is an ingredient.  Also she started systane gel eye drops 3 days ago and having eye swelling.  Please advise on what test we will perform. She had patch testing back in November of 2021. Does additional patch testing need to be done?

## 2020-02-06 NOTE — Telephone Encounter (Signed)
Grace Hunt, I am a little bit in the dark about this issue.  I looked through the chart and I do not see a request to have any type of polyethylene glycol testing.  My last note states that she was doing relatively well.  If she not doing well at this point?

## 2020-02-07 MED ORDER — PREDNISONE 10 MG PO TABS: 10.0000 mg | ORAL_TABLET | Freq: Every day | ORAL | 0 refills | Status: AC

## 2020-02-07 MED ORDER — DOXYCYCLINE HYCLATE 50 MG PO CAPS: 50.0000 mg | ORAL_CAPSULE | Freq: Every day | ORAL | 0 refills | Status: AC

## 2020-02-07 NOTE — Telephone Encounter (Signed)
Lets start with doxycycline 50 mg 1 time per day and have her contact us in 2 weeks.  We may need to go up to 100 mg if we see no response within 2 weeks.

## 2020-02-07 NOTE — Telephone Encounter (Signed)
Left message for patient to call back regarding for long tern treatment. Sent in prednisone 10 mg tab x for ten days to [facility]'s [facility].

## 2020-02-07 NOTE — Telephone Encounter (Signed)
Patient called and has a red face and has some swelling and bumps. She said that she need to get some predisone about 16 tablets and that is what helps. [facility] . [phone].

## 2020-02-07 NOTE — Telephone Encounter (Signed)
Please inform Grace Hunt that sometimes this inflammation of the face can be helped with daily doxycycline administration.  Has she ever tried daily doxycycline administration at a low dose?  We can give her prednisone 10 mg a day for 10 days for her recent flareup but we need a more long-term approach to this issue.

## 2020-02-07 NOTE — Addendum Note (Signed)
Addended by: Valere Dross on: 02/07/2020 02:17 PM   Modules accepted: Orders

## 2020-02-07 NOTE — Telephone Encounter (Signed)
Patient is wanting to try the long term approach and do the doxycycline administration. Please advise on this and will like to be notified when sent.

## 2020-02-07 NOTE — Telephone Encounter (Signed)
Patient notified and verbalized understanding. 

## 2020-02-19 ENCOUNTER — Telehealth: Payer: Self-pay | Admitting: Allergy and Immunology

## 2020-02-19 NOTE — Telephone Encounter (Signed)
Dr. Ernst Bowler do you offer this type of testing for propylene glycol?

## 2020-02-19 NOTE — Telephone Encounter (Signed)
Patient is wanting to get tested he this which is the main ingredient causing her contact dermatitis, patient is currently taking doxycycline. Patient is wanting this particular test and Vegetable glycerin found in vaping. Believes this is causing her symptoms from the last year. She has changed all her shampoos and other things around. She mentioned she spoke with several others who had the similar problems in the past and patient states she vapes contently. Please advise on this.

## 2020-02-19 NOTE — Telephone Encounter (Signed)
I believe that Dr. Ernst Bowler does testing for this agent.  Please confirm and refer her onto Dr. Ernst Bowler.

## 2020-02-19 NOTE — Telephone Encounter (Signed)
So she wants testing for propylene glycol sensitivity???

## 2020-02-19 NOTE — Telephone Encounter (Signed)
Patient called and has some question about the propylene glycol. She thinks that she may need testing for vapor. [phone]

## 2020-02-19 NOTE — Telephone Encounter (Signed)
We do vaccine component testing for COVID-19 vaccines that includes polyethylene glycol, not propylene glycol.  I am going to route to Dr. Maudie Mercury and Nelva Bush to see if they know anything about this.    Salvatore Marvel, MD [facility] [place] [facility] [place]

## 2020-02-19 NOTE — Telephone Encounter (Signed)
Yes she wants that testing? Do we offer this

## 2020-02-20 NOTE — Telephone Encounter (Signed)
Left a detail message stating if patient would like to see a dermatologist to let us know to send a referral regarding propylene glycol.

## 2020-02-20 NOTE — Telephone Encounter (Signed)
Stopping vaping would also be a consideration. Just a thought...  Salvatore Marvel, MD [facility] [place] [facility] [place]

## 2020-02-20 NOTE — Telephone Encounter (Signed)
No I am not aware of testing for propylene glycol. The patch testing that we offer does not contain propylene glycol.  If she is wanting a contact dermatitis evaluation for propylene glycol then she might be best at a dermatologist that does patch testing and has a more comprehensive panel than what we have.

## 2020-02-21 NOTE — Telephone Encounter (Signed)
Please advise on referral to dermatology

## 2020-02-21 NOTE — Telephone Encounter (Signed)
Patient states she has a dermatologist who offered a patch testing in the past for this but patient was unsure to pursue this since it could have been in accurate. Please advise a dermatologist that can offer this type of testing.

## 2020-02-21 NOTE — Telephone Encounter (Signed)
I have no advice about where to have this test performed.  She should consider going back to the dermatologist who offered this test initially.

## 2020-02-21 NOTE — Telephone Encounter (Signed)
Agree with Dermatology referral. BUT we cannot put that in since we have never seen her. See if her PCP can make that referral.   Salvatore Marvel, MD [facility] [place] [facility]

## 2020-02-21 NOTE — Telephone Encounter (Signed)
Patient notified and verbalized understanding. 

## 2020-02-28 NOTE — Telephone Encounter (Signed)
Pt called and asked to speak to [facility] about a medication Dr. Neldon Mc prescribed to her. Pt said she was asked to report back to [place] about medication, doxycyline. She was unsure if it had any affect since she was using other medications at the same time.   Please advise.

## 2020-02-28 NOTE — Telephone Encounter (Signed)
Left a detail message regarding patient having medication of Doxycycline 50 mg for 2 weeks and need to follow up with [place] to see if it has improved her symptoms. There is a different trend regarding this medication.

## 2020-02-29 NOTE — Telephone Encounter (Signed)
Called and left a voicemail asking for patient to return call to discuss.  °

## 2020-02-29 NOTE — Telephone Encounter (Signed)
Pt received call from [place] and called back asking to speak to [place]. Call back number is [phone].   Please advise.

## 2020-03-05 NOTE — Telephone Encounter (Signed)
Spoke with patient and she is going to contact her Dermatologist to see if there is anything they could do regarding patch testing.

## 2020-03-05 NOTE — Telephone Encounter (Signed)
Patient stated that her symptoms have not improved and stated she was having nausea while taking the Doxycyline along with prednisone. She is not sure if it was helping and stated she is looking for a long term solution. She stated she wanted to see if she can maybe do 75 mg instead of 100 mg of Doxycycline and if her dose for prednisone could be increased Please advise?

## 2020-03-05 NOTE — Telephone Encounter (Signed)
Okay, I am a little bit confused.  Are we prescribing the prednisone or who is prescribing the prednisone?  Is that the combination of doxycycline and prednisone that is making her sick were suggested doxycycline?  I think she would be best served by making an appointment to come see [place] so we can discuss these issues directly and take a look at her scan.  Using telephone medicine is not really the best form of treatment.  Maybe we can get the notes from the dermatologist forwarded to our office.

## 2020-03-05 NOTE — Telephone Encounter (Signed)
Left message

## 2020-03-06 NOTE — Telephone Encounter (Signed)
Patient is scheduled to see Ronald Lobo, FNP tomorrow 03/07/2020 at 10 AM. Patient is going to have dermatology fax over office notes regarding her past treatments. She did call to speak with them regarding the patch testing. Will let us know further.

## 2020-03-06 NOTE — Telephone Encounter (Signed)
Attempted to call again and left a message

## 2020-03-07 ENCOUNTER — Other Ambulatory Visit: Payer: Self-pay

## 2020-03-07 ENCOUNTER — Encounter: Payer: Self-pay | Admitting: Family Medicine

## 2020-03-07 ENCOUNTER — Encounter: Payer: BC Managed Care – PPO | Admitting: Family Medicine

## 2020-03-07 ENCOUNTER — Ambulatory Visit (INDEPENDENT_AMBULATORY_CARE_PROVIDER_SITE_OTHER): Payer: BC Managed Care – PPO | Admitting: Family Medicine

## 2020-03-07 VITALS — BP 128/84 | HR 100 | Resp 18

## 2020-03-07 DIAGNOSIS — H04123 Dry eye syndrome of bilateral lacrimal glands: Secondary | ICD-10-CM | POA: Diagnosis not present

## 2020-03-07 DIAGNOSIS — Z91018 Allergy to other foods: Secondary | ICD-10-CM

## 2020-03-07 DIAGNOSIS — Z720 Tobacco use: Secondary | ICD-10-CM | POA: Diagnosis not present

## 2020-03-07 DIAGNOSIS — L989 Disorder of the skin and subcutaneous tissue, unspecified: Secondary | ICD-10-CM | POA: Diagnosis not present

## 2020-03-07 MED ORDER — DOXYCYCLINE HYCLATE 50 MG PO CAPS: ORAL_CAPSULE | ORAL | 0 refills | Status: DC

## 2020-03-07 MED ORDER — METRONIDAZOLE 0.75 % EX CREA: TOPICAL_CREAM | Freq: Two times a day (BID) | CUTANEOUS | 5 refills | Status: DC | PRN

## 2020-03-07 MED ORDER — EPINEPHRINE 0.3 MG/0.3ML IJ SOAJ: 0.3000 mg | INTRAMUSCULAR | 1 refills | Status: AC | PRN

## 2020-03-07 NOTE — Patient Instructions (Addendum)
Dermatitis We have ordered a lab to help [place] determine or rule out a source of your rash.  We will call you when the results of this test become available. Begin doxycycline 50 mg twice a day for 4 weeks Continue Metrocream 0.7% twice a day as needed For flushing episodes continue to avoid extremes of temperature, sunlight, spicy foods, alcohol, psychologic stressors Skin care includes daily moisturization routine, MetroGel, avoiding irritating topical agents, sun protection, and skin cleansing with dye free products and lukewarm water. Patch testing at a previous visit was positive to nickel, colophony, formaldehyde, neomycin, epoxy resin, gold, and bacitracin Continue desonide 0.05% ointment to red itchy areas on your face or neck twice a day as needed If no improvement in your rash after 4 weeks, we will consider skin biopsy or referral to academic dermatology  Dry eye syndrome of both eyes Continue Systane eyedrops as needed for moisturization  Tree nut allergy Continue avoidance of tree nuts.  In case of an allergic reaction, take Benadryl 50 mg  every 4 hours, and if life-threatening symptoms occur, inject with EpiPen 0.3 mg.  Vape/tobacco Try to cut down or quit vaping and tobacco use  Call the clinic if this treatment plan is not working well for you  Follow up in 4 weeks or sooner if needed.

## 2020-03-07 NOTE — Progress Notes (Signed)
[facility] [place] [address] Dept: [phone]  FOLLOW UP NOTE  Patient ID: Grace Hunt, female    DOB: Jun 24, 1960  Age: 60 y.o. MRN: [id number] Date of Office Visit: 03/07/2020  Assessment  Chief Complaint: Eczema (Since march of last year - no changes even with medication //Pt. Declined height/weight she stated that she declines it on all visits she does not understand why we need to know that information )  HPI Grace Hunt is a 60 year old female who presents to the clinic for follow-up visit.  She was last seen in this clinic on 12/18/2019 by Dr. Neldon Mc for evaluation of dermatitis, allergic rhinitis, dry eye syndrome, and food allergy to tree nuts.  At today's visit, she reports that the rash on her face has not improved since her last visit to this clinic.  She reports that she has been to dermatology at least 6 times and received prednisone on some of those occasions which relieved her symptoms.  At today's visit she reports rash occurring on her forehead, cheeks, around her mouth, and occasionally on her neck that occurs in a flare in remission pattern.  She reports the rash as erythematous, with small bumps, some dry and flaky areas, and feels like sandpaper to touch.  She does report occasional flushing which is worse in the morning when she wakes up [place] progressively improves throughout the day.  She reports that she experiences a flare in this rash every 2 to 4 weeks and the rash is rarely fully resolved.  She does report taking doxycycline and using MetroGel in addition to desonide with almost full resolution about 1 year ago.  She also reports red, itchy, flaky areas occurring around both eyes in a flare in remission pattern.  Previous lab work reveals negative ANA on 11/20/2019 and patch testing on 11/26/2019 reveals sensitivity toward nickel, neomycin, colophony, epoxy resin, formaldehyde, gold, and bacitracin.  She has not had a skin biopsy for this  condition at this time.  She does report chronic dry eye syndrome and a new symptom occurring about 1 year ago of chronic dry mouth.  She continues a daily moisturizing routine with CeraVe and alternates between desonide 0.05% ointment and MetroGel.  She continues to avoid tree nuts with no accidental exposure or EpiPen use since her last visit to this clinic.  She is using Systane as needed for dry eyes.  Her current medications are listed in the chart.   Drug Allergies:  No Known Allergies  Physical Exam: BP 128/84   Pulse 100   Resp 18   LMP 03/03/2012   SpO2 98%    Physical Exam Vitals reviewed.  Constitutional:      Appearance: Normal appearance.  HENT:     Head: Normocephalic and atraumatic.     Right Ear: Tympanic membrane normal.     Left Ear: Tympanic membrane normal.     Nose:     Comments: Bilateral nares slightly erythematous with clear nasal drainage noted.  Pharynx normal.  Ears normal.  Eyes normal.    Mouth/Throat:     Pharynx: Oropharynx is clear.  Eyes:     Conjunctiva/sclera: Conjunctivae normal.  Cardiovascular:     Rate and Rhythm: Normal rate and regular rhythm.     Heart sounds: Normal heart sounds. No murmur heard.   Pulmonary:     Effort: Pulmonary effort is normal.     Breath sounds: Normal breath sounds.     Comments: Lungs clear to  auscultation Musculoskeletal:        General: Normal range of motion.     Cervical back: Normal range of motion and neck supple.  Skin:    General: Skin is warm.     Comments: Erythematous patches noted on bilateral cheeks and extending down to her upper lip.  Raised red areas scattered on her cheeks and around her mouth.  No pustules noted.  No open areas or drainage noted.  Neurological:     Mental Status: She is alert.      Assessment and Plan: 1. Inflammatory dermatosis   2. Tree nut allergy   3. Dry eye syndrome of both eyes   4. Vapes nicotine containing substance     Meds ordered this encounter   Medications  . EPINEPHrine 0.3 mg/0.3 mL IJ SOAJ injection    Sig: Inject 0.3 mg into the muscle as needed for anaphylaxis.    Dispense:  2 each    Refill:  1  . doxycycline (VIBRAMYCIN) 50 MG capsule    Sig: Take twice a day for 4 weeks then stop    Dispense:  56 capsule    Refill:  0  . metroNIDAZOLE (METROCREAM) 0.75 % cream    Sig: Apply topically 2 (two) times daily as needed.    Dispense:  45 g    Refill:  5    Patient Instructions  Dermatitis We have ordered a lab to help [place] determine or rule out a source of your rash.  We will call you when the results of this test become available. Begin doxycycline 50 mg twice a day for 4 weeks Continue Metrocream 0.7% twice a day as needed For flushing episodes continue to avoid extremes of temperature, sunlight, spicy foods, alcohol, psychologic stressors Skin care includes daily moisturization routine, MetroGel, avoiding irritating topical agents, sun protection, and skin cleansing with dye free products and lukewarm water. Patch testing at a previous visit was positive to nickel, colophony, formaldehyde, neomycin, epoxy resin, gold, and bacitracin Continue desonide 0.05% ointment to red itchy areas on your face or neck twice a day as needed If no improvement in your rash after 4 weeks, we will consider skin biopsy or referral to academic dermatology  Dry eye syndrome of both eyes Continue Systane eyedrops as needed for moisturization  Tree nut allergy Continue avoidance of tree nuts.  In case of an allergic reaction, take Benadryl 50 mg  every 4 hours, and if life-threatening symptoms occur, inject with EpiPen 0.3 mg.  Vape/tobacco Try to cut down or quit vaping and tobacco use  Call the clinic if this treatment plan is not working well for you  Follow up in 4 weeks or sooner if needed.    Return in about 3 months (around 06/04/2020), or if symptoms worsen or fail to improve.    Thank you for the opportunity to care for this  patient.  Please do not hesitate to contact me with questions.  Gareth Morgan, FNP [facility] [place] [facility] [place]

## 2020-03-07 NOTE — Telephone Encounter (Signed)
Thank you :)

## 2020-03-08 LAB — ANA W/REFLEX IF POSITIVE: Anti Nuclear Antibody (ANA): NEGATIVE

## 2020-03-10 NOTE — Progress Notes (Signed)
Can you please let this patient know the ANA was negative. Thank you. Please ask if she is still having the rash. Thank you

## 2020-04-15 ENCOUNTER — Other Ambulatory Visit: Payer: Self-pay

## 2020-04-15 ENCOUNTER — Encounter: Payer: Self-pay | Admitting: Allergy and Immunology

## 2020-04-15 ENCOUNTER — Ambulatory Visit (INDEPENDENT_AMBULATORY_CARE_PROVIDER_SITE_OTHER): Payer: BC Managed Care – PPO | Admitting: Allergy and Immunology

## 2020-04-15 VITALS — BP 120/70 | HR 98 | Temp 98.0°F | Resp 16

## 2020-04-15 DIAGNOSIS — H04123 Dry eye syndrome of bilateral lacrimal glands: Secondary | ICD-10-CM

## 2020-04-15 DIAGNOSIS — L719 Rosacea, unspecified: Secondary | ICD-10-CM

## 2020-04-15 DIAGNOSIS — Z91018 Allergy to other foods: Secondary | ICD-10-CM | POA: Diagnosis not present

## 2020-04-15 DIAGNOSIS — Z720 Tobacco use: Secondary | ICD-10-CM

## 2020-04-15 MED ORDER — DOXYCYCLINE HYCLATE 50 MG PO CAPS: 50.0000 mg | ORAL_CAPSULE | Freq: Two times a day (BID) | ORAL | 5 refills | Status: DC

## 2020-04-15 NOTE — Patient Instructions (Addendum)
  1.  Doxycycline 50 mg twice a day  2.  Metrocream to face twice day  3.  Can use OTC Systane eyedrops for moisturization if needed  4.  Can use EpiPen if needed  5.  Return to clinic in 6 months or earlier if problem

## 2020-04-15 NOTE — Progress Notes (Signed)
[facility]   Follow-up Note  Referring Provider: Shirline Frees, MD Primary Provider: Shirline Frees, MD Date of Office Visit: 04/15/2020  Subjective:   Grace Hunt (DOB: 1960-11-16) is a 60 y.o. female who returns to the [facility] on 04/15/2020 in re-evaluation of the following:  HPI: Grace Hunt returns to this clinic in evaluation of inflammatory dermatosis, with a combination of contact dermatitis and rosacea, history of allergic rhinitis, history of dry eye syndrome, history of food allergy directed against tree nut, and vaping.  I last saw in his clinic on 18 December 2019 and she visited with our nurse practitioner on 07 March 2020.  While utilizing doxycycline and MetroCream on a consistent basis she does very well with the skin on her face.  It does appear as though she requires 50 mg tablet of doxycycline twice a day to get this under control. She continues to use MetroCream twice a day on a consistent basis.  She remains away from consumption of tree nuts.  She continues to vape at this point in time.  Allergies as of 04/15/2020      Reactions   Lexapro [escitalopram] Other (See Comments)      Medication List      ALPRAZolam 0.5 MG tablet Commonly known as: XANAX Take 0.25 mg by mouth daily as needed.   CALTRATE 600 PO Take by mouth.   CENTRUM WOMEN PO Take by mouth.   doxycycline 50 MG capsule Commonly known as: VIBRAMYCIN Take twice a day for 4 weeks then stop   EPINEPHrine 0.3 mg/0.3 mL Soaj injection Commonly known as: EPI-PEN Inject 0.3 mg into the muscle as needed for anaphylaxis.   eszopiclone 2 MG Tabs tablet Commonly known as: LUNESTA Take 2 mg by mouth at bedtime as needed.   metroNIDAZOLE 0.75 % cream Commonly known as: METROCREAM Apply topically 2 (two) times daily as needed.   OMEGA 3 500 PO Take by mouth.   omeprazole 20 MG capsule Commonly known as:  PRILOSEC Take 20 mg by mouth daily.   phentermine 37.5 MG tablet Commonly known as: ADIPEX-P Take 37.5 mg by mouth daily.   tiZANidine 2 MG tablet Commonly known as: ZANAFLEX Take 2 mg by mouth at bedtime.   UNABLE TO FIND Med Name: apple cider vinegar       History reviewed. No pertinent past medical history.  Past Surgical History:  Procedure Laterality Date  . vocal cord surgery      Review of systems negative except as noted in HPI / PMHx or noted below:  Review of Systems  Constitutional: Negative.   HENT: Negative.   Eyes: Negative.   Respiratory: Negative.   Cardiovascular: Negative.   Gastrointestinal: Negative.   Genitourinary: Negative.   Musculoskeletal: Negative.   Skin: Negative.   Neurological: Negative.   Endo/Heme/Allergies: Negative.   Psychiatric/Behavioral: Negative.      Objective:   Vitals:   04/15/20 1531  BP: 120/70  Pulse: 98  Resp: 16  Temp: 98 F (36.7 C)  SpO2: 97%          Physical Exam Skin:    Findings: Rash (Facial erythema) present.     Diagnostics: none  Assessment and Plan:   1. Rosacea   2. Tree nut allergy   3. Dry eye syndrome of both eyes   4. Vapes nicotine containing substance       1.  Doxycycline 50 mg twice a day  2.  MetroCream to face twice a day  3.  Can use OTC Systane eyedrops for moisturization if needed  4.  Can use EpiPen if needed  5.  Return to clinic in 6 months or earlier if problem  Grace Hunt will utilize some dose of doxycycline and MetroCream on a pretty consistent basis and we will see her back in this clinic in 6 months or earlier if there is a problem.  Obviously she is going to remain away from tree nut consumption.  It would be best if she did not vape but it is better that she vapes then smokes tobacco products at this point in time.  Allena Katz, MD Allergy / [facility] [place]

## 2020-04-16 ENCOUNTER — Encounter: Payer: Self-pay | Admitting: Allergy and Immunology

## 2020-08-11 ENCOUNTER — Ambulatory Visit: Payer: Self-pay

## 2020-08-11 ENCOUNTER — Ambulatory Visit (INDEPENDENT_AMBULATORY_CARE_PROVIDER_SITE_OTHER): Payer: BC Managed Care – PPO | Admitting: Orthopedic Surgery

## 2020-08-11 DIAGNOSIS — M79604 Pain in right leg: Secondary | ICD-10-CM | POA: Diagnosis not present

## 2020-08-11 DIAGNOSIS — M79671 Pain in right foot: Secondary | ICD-10-CM | POA: Diagnosis not present

## 2020-08-11 MED ORDER — PREDNISONE 10 MG PO TABS: 10.0000 mg | ORAL_TABLET | Freq: Every day | ORAL | 1 refills | Status: DC

## 2020-08-14 ENCOUNTER — Ambulatory Visit: Payer: BC Managed Care – PPO | Admitting: Orthopedic Surgery

## 2020-08-15 ENCOUNTER — Encounter: Payer: Self-pay | Admitting: Orthopedic Surgery

## 2020-08-15 NOTE — Progress Notes (Signed)
Office Visit Note   Patient: Grace Hunt           Date of Birth: 06-24-1960           MRN: [id number] Visit Date: 08/11/2020              Requested by: Shirline Frees, MD [place],  [place] [address] PCP: Shirline Frees, MD  Chief Complaint  Patient presents with   Right Leg - Pain   Right Foot - Pain      HPI: Patient is a 60 year old woman who is seen in referral from Dr. Kenton Kingfisher.  She states she gets numbness across the metatarsal heads at the end of the day she also complains of pain shooting up the back of her leg and waking her up at night.  She has taken Zanaflex and Advil.  She complains of of painful clawing of the second and third toes with pain with weight  Assessment & Plan: Visit Diagnoses:  1. Acute pain of right lower extremity   2. Pain in right foot     Plan: Patient is sent and a prescription for prednisone for her radicular symptoms.  Recommended Achilles stretching to help unload the metatarsal heads and reevaluate in 4 weeks.  Patient may benefit from Weil osteotomies of the second third and fourth metatarsals.  Possible PIP resection of the second and third toes.  Follow-Up Instructions: Return in about 4 weeks (around 09/08/2020).   Ortho Exam  Patient is alert, oriented, no adenopathy, well-dressed, normal affect, normal respiratory effort. Patient has fixed clawing of the second and third toes she has dorsiflexion to neutral she has a negative straight leg raise on the left no focal motor weakness.  Patient has a good dorsalis pedis pulse.  Imaging: No results found. No images are attached to the encounter.  Labs: No results found for: HGBA1C, ESRSEDRATE, CRP, LABURIC, REPTSTATUS, GRAMSTAIN, CULT, LABORGA   No results found for: ALBUMIN, PREALBUMIN, CBC  No results found for: MG No results found for: VD25OH  No results found for: PREALBUMIN CBC EXTENDED Latest Ref Rng & Units 08/10/2010  HGB 12.0 - 15.0  g/dL 13.5     There is no height or weight on file to calculate BMI.  Orders:  Orders Placed This Encounter  Procedures   XR Foot 2 Views Right   XR Lumbar Spine 2-3 Views   Meds ordered this encounter  Medications   predniSONE (DELTASONE) 10 MG tablet    Sig: Take 1 tablet (10 mg total) by mouth daily with breakfast.    Dispense:  30 tablet    Refill:  1     Procedures: No procedures performed  Clinical Data: No additional findings.  ROS:  All other systems negative, except as noted in the HPI. Review of Systems  Objective: Vital Signs: LMP 03/03/2012   Specialty Comments:  No specialty comments available.  PMFS History: Patient Active Problem List   Diagnosis Date Noted   Contact dermatitis due to chemicals 11/22/2019   History reviewed. No pertinent past medical history.  Family History  Problem Relation Age of Onset   Allergic rhinitis Neg Hx    Angioedema Neg Hx    Asthma Neg Hx    Eczema Neg Hx    Immunodeficiency Neg Hx    Urticaria Neg Hx     Past Surgical History:  Procedure Laterality Date   vocal cord surgery     Social History  Occupational History   Not on file  Tobacco Use   Smoking status: Former    Types: Cigarettes    Quit date: 01/18/2009    Years since quitting: 11.5   Smokeless tobacco: Never  Vaping Use   Vaping Use: Every day  Substance and Sexual Activity   Alcohol use: Yes    Comment: occ   Drug use: Never   Sexual activity: Not on file

## 2020-09-08 ENCOUNTER — Ambulatory Visit: Payer: BC Managed Care – PPO | Admitting: Orthopedic Surgery

## 2020-10-16 ENCOUNTER — Other Ambulatory Visit: Payer: Self-pay | Admitting: Orthopedic Surgery

## 2020-10-21 ENCOUNTER — Ambulatory Visit (INDEPENDENT_AMBULATORY_CARE_PROVIDER_SITE_OTHER): Payer: BC Managed Care – PPO | Admitting: Allergy and Immunology

## 2020-10-21 ENCOUNTER — Other Ambulatory Visit: Payer: Self-pay

## 2020-10-21 ENCOUNTER — Encounter: Payer: Self-pay | Admitting: Allergy and Immunology

## 2020-10-21 VITALS — BP 124/80 | HR 104 | Temp 97.8°F | Resp 18

## 2020-10-21 DIAGNOSIS — Z91018 Allergy to other foods: Secondary | ICD-10-CM

## 2020-10-21 DIAGNOSIS — L719 Rosacea, unspecified: Secondary | ICD-10-CM

## 2020-10-21 DIAGNOSIS — J3089 Other allergic rhinitis: Secondary | ICD-10-CM | POA: Diagnosis not present

## 2020-10-21 DIAGNOSIS — H04123 Dry eye syndrome of bilateral lacrimal glands: Secondary | ICD-10-CM | POA: Diagnosis not present

## 2020-10-21 MED ORDER — METRONIDAZOLE 0.75 % EX CREA: TOPICAL_CREAM | Freq: Two times a day (BID) | CUTANEOUS | 5 refills | Status: DC | PRN

## 2020-10-21 NOTE — Progress Notes (Signed)
[facility]   Follow-up Note  Referring Provider: Shirline Frees, MD Primary Provider: Shirline Frees, MD Date of Office Visit: 10/21/2020  Subjective:   Grace Hunt (DOB: 1960-04-10) is a 60 y.o. female who returns to the [facility] on 10/21/2020 in re-evaluation of the following:  HPI: Grace Hunt returns to this clinic in evaluation of rosacea with blepharoconjunctivitis, allergic conjunctivitis, dry eye syndrome, and history of allergic rhinitis and history of food allergy directed against tree nut.  I last saw her in this clinic on 15 April 2020.  For some reason she discontinued her doxycycline.  This appeared to be a decision based upon an opposition to using medications on a consistent basis.  She was given higher doses of antihistamines by her primary care doctor which have not helped the issue involving her eyes or her face.  She has not been having any problems with her nose while using Flonase.  She remains away from consumption of tree nuts.  She has obtained the flu vaccine this year.  Allergies as of 10/21/2020       Reactions   Bacitracin    Colophony [pinus Strobus]    Formaldehyde    Gold Sodium Thiosulfate    Lexapro [escitalopram] Other (See Comments)   Neomycin    Nickel         Medication List    ALPRAZolam 0.5 MG tablet Commonly known as: XANAX Take 0.25 mg by mouth daily as needed.   CALTRATE 600 PO Take by mouth.   CENTRUM WOMEN PO Take by mouth.   doxycycline 50 MG capsule Commonly known as: VIBRAMYCIN Take 1 capsule (50 mg total) by mouth 2 (two) times daily.   EPINEPHrine 0.3 mg/0.3 mL Soaj injection Commonly known as: EPI-PEN Inject 0.3 mg into the muscle as needed for anaphylaxis.   eszopiclone 2 MG Tabs tablet Commonly known as: LUNESTA Take 2 mg by mouth at bedtime as needed.   ibuprofen 800 MG tablet Commonly known as: ADVIL Take 800 mg by mouth every  8 (eight) hours as needed.   levocetirizine 5 MG tablet Commonly known as: XYZAL levocetirizine 5 mg tablet   metroNIDAZOLE 0.75 % cream Commonly known as: METROCREAM Apply topically 2 (two) times daily as needed.   OMEGA 3 500 PO Take by mouth.   omeprazole 20 MG capsule Commonly known as: PRILOSEC Take 20 mg by mouth daily.   phentermine 37.5 MG tablet Commonly known as: ADIPEX-P Take 37.5 mg by mouth daily.   tiZANidine 2 MG tablet Commonly known as: ZANAFLEX Take 2 mg by mouth at bedtime.   UNABLE TO FIND Med Name: apple cider vinegar    History reviewed. No pertinent past medical history.  Past Surgical History:  Procedure Laterality Date   vocal cord surgery      Review of systems negative except as noted in HPI / PMHx or noted below:  Review of Systems  Constitutional: Negative.   HENT: Negative.    Eyes: Negative.   Respiratory: Negative.    Cardiovascular: Negative.   Gastrointestinal: Negative.   Genitourinary: Negative.   Musculoskeletal: Negative.   Skin: Negative.   Neurological: Negative.   Endo/Heme/Allergies: Negative.   Psychiatric/Behavioral: Negative.      Objective:   Vitals:   10/21/20 0914  BP: 124/80  Pulse: (!) 104  Resp: 18  Temp: 97.8 F (36.6 C)  SpO2: 100%  Physical Exam Constitutional:      Appearance: She is not diaphoretic.  HENT:     Head: Normocephalic.     Right Ear: Tympanic membrane, ear canal and external ear normal.     Left Ear: Tympanic membrane, ear canal and external ear normal.     Nose: Nose normal. No mucosal edema or rhinorrhea.     Mouth/Throat:     Pharynx: Uvula midline. No oropharyngeal exudate.  Eyes:     Conjunctiva/sclera: Conjunctivae normal.  Neck:     Thyroid: No thyromegaly.     Trachea: Trachea normal. No tracheal tenderness or tracheal deviation.  Cardiovascular:     Rate and Rhythm: Normal rate and regular rhythm.     Heart sounds: Normal heart sounds, S1 normal and  S2 normal. No murmur heard. Pulmonary:     Effort: No respiratory distress.     Breath sounds: Normal breath sounds. No stridor. No wheezing or rales.  Lymphadenopathy:     Head:     Right side of head: No tonsillar adenopathy.     Left side of head: No tonsillar adenopathy.     Cervical: No cervical adenopathy.  Skin:    Findings: Rash (Facial erythema and induration) present. No erythema.     Nails: There is no clubbing.  Neurological:     Mental Status: She is alert.    Diagnostics: none  Assessment and Plan:   1. Rosacea   2. Dry eye syndrome of both eyes   3. Perennial allergic rhinitis   4. Tree nut allergy       1.  RESTART Doxycycline 50 mg twice a day  2.  CONTINUE Metrocream to face twice day  3.  CONTINUE Flonase - 1-2 sprays each nostril 1 time per day  4.  Can use OTC Systane eyedrops for moisturization if needed  5.  Can use EpiPen if needed  6.  DO NOT USE antihistamines  7.  Do not touch / rub eye (as much as possible)  8.  Return to clinic in 6 months or earlier if problem  In an ideal world Grace Hunt would consistently use doxycycline and MetroCream to deal with the inflammation of her facial skin and her blepharo conjunctivitis and she would avoid all antihistamines and use over-the-counter Systane eyedrops multiple times per day to address her dry eye syndrome.  That is the recommendation that I gave her today and I think that if she can follow this plan she will do relatively well regarding her skin and eye issue.  I will see her back in this clinic in 6 months or earlier if there is a problem.  Allena Katz, MD [facility] / [facility] [place]

## 2020-10-21 NOTE — Patient Instructions (Addendum)
  1.  RESTART Doxycycline 50 mg twice a day  2.  CONTINUE Metrocream to face twice day  3.  CONTINUE Flonase - 1-2 sprays each nostril 1 time per day  4.  Can use OTC Systane eyedrops for moisturization if needed  5.  Can use EpiPen if needed  6.  DO NOT USE antihistamines  7.  Do not touch / rub eye (as much as possible)  8.  Return to clinic in 6 months or earlier if problem

## 2020-10-22 ENCOUNTER — Encounter: Payer: Self-pay | Admitting: Allergy and Immunology

## 2020-11-03 ENCOUNTER — Telehealth: Payer: Self-pay | Admitting: Allergy and Immunology

## 2020-11-03 NOTE — Telephone Encounter (Signed)
Patient is requesting a prescription for doxycycline. She said Dr. Neldon Mc wants her to take it from now on and she said with the current prescription, she does not have enough to get her through the month. She is requesting a 30 day supply. She has a change of Pharmacy: She would like it sent to [facility] on [address].

## 2020-11-03 NOTE — Telephone Encounter (Signed)
Please advise regarding a 30 day supply of Doxycycline.

## 2020-11-04 ENCOUNTER — Other Ambulatory Visit: Payer: Self-pay

## 2020-11-04 MED ORDER — DOXYCYCLINE HYCLATE 50 MG PO CAPS: 50.0000 mg | ORAL_CAPSULE | Freq: Two times a day (BID) | ORAL | 11 refills | Status: DC

## 2020-11-04 NOTE — Telephone Encounter (Signed)
Do you want her to take the doxycycline 50mg  one tablet daily as needed or twice a day as needed  with a 1 year supply

## 2020-11-04 NOTE — Telephone Encounter (Signed)
Called patient to let her know Dr. Neldon Mc sent in Doxycycline twice a day with a one year supply. I left a message for the patient to call with any questions or concerns.

## 2020-11-04 NOTE — Telephone Encounter (Signed)
Doxycycline 50mg  twice a day for 1 year has been sent in and patient has been notified. I left a detailed voicemail about the dose and amount that was sent in.

## 2021-03-12 DIAGNOSIS — H04123 Dry eye syndrome of bilateral lacrimal glands: Secondary | ICD-10-CM | POA: Diagnosis not present

## 2021-03-12 DIAGNOSIS — H10413 Chronic giant papillary conjunctivitis, bilateral: Secondary | ICD-10-CM | POA: Diagnosis not present

## 2021-03-26 ENCOUNTER — Other Ambulatory Visit: Payer: Self-pay | Admitting: Obstetrics & Gynecology

## 2021-03-26 DIAGNOSIS — Z1231 Encounter for screening mammogram for malignant neoplasm of breast: Secondary | ICD-10-CM

## 2021-04-14 ENCOUNTER — Other Ambulatory Visit: Payer: Self-pay | Admitting: Orthopedic Surgery

## 2021-04-21 ENCOUNTER — Ambulatory Visit: Payer: BC Managed Care – PPO | Admitting: Allergy and Immunology

## 2021-05-12 ENCOUNTER — Other Ambulatory Visit: Payer: Self-pay | Admitting: Orthopedic Surgery

## 2021-05-13 ENCOUNTER — Telehealth: Payer: Self-pay | Admitting: Orthopedic Surgery

## 2021-05-13 NOTE — Telephone Encounter (Signed)
Pt says she does not need this Rx. Please "refuse" so it will close out, thank you! ?

## 2021-05-13 NOTE — Telephone Encounter (Signed)
Please call the pt --she is returning your call  ?

## 2021-05-13 NOTE — Telephone Encounter (Signed)
Sw pt, this was on medication refill request [facility] on [facility]one. She said that she did not request and does not need this. ?

## 2021-05-13 NOTE — Telephone Encounter (Signed)
lmtcb

## 2021-05-15 ENCOUNTER — Ambulatory Visit
Admission: RE | Admit: 2021-05-15 | Discharge: 2021-05-15 | Disposition: A | Discharge status: Home / Self Care | Payer: BC Managed Care – PPO | Source: Ambulatory Visit | Attending: Obstetrics & Gynecology | Admitting: Obstetrics & Gynecology

## 2021-05-15 DIAGNOSIS — Z1231 Encounter for screening mammogram for malignant neoplasm of breast: Secondary | ICD-10-CM

## 2021-10-20 DIAGNOSIS — Z23 Encounter for immunization: Secondary | ICD-10-CM | POA: Diagnosis not present

## 2021-10-20 DIAGNOSIS — M2559 Pain in other specified joint: Secondary | ICD-10-CM | POA: Diagnosis not present

## 2021-10-20 DIAGNOSIS — E7800 Pure hypercholesterolemia, unspecified: Secondary | ICD-10-CM | POA: Diagnosis not present

## 2021-10-20 DIAGNOSIS — Z0000 Encounter for general adult medical examination without abnormal findings: Secondary | ICD-10-CM | POA: Diagnosis not present

## 2021-10-27 ENCOUNTER — Ambulatory Visit: Payer: BC Managed Care – PPO | Admitting: Allergy and Immunology

## 2021-10-30 DIAGNOSIS — D2261 Melanocytic nevi of right upper limb, including shoulder: Secondary | ICD-10-CM | POA: Diagnosis not present

## 2021-10-30 DIAGNOSIS — L720 Epidermal cyst: Secondary | ICD-10-CM | POA: Diagnosis not present

## 2021-10-30 DIAGNOSIS — L821 Other seborrheic keratosis: Secondary | ICD-10-CM | POA: Diagnosis not present

## 2021-10-30 DIAGNOSIS — D2262 Melanocytic nevi of left upper limb, including shoulder: Secondary | ICD-10-CM | POA: Diagnosis not present

## 2021-11-05 DIAGNOSIS — H524 Presbyopia: Secondary | ICD-10-CM | POA: Diagnosis not present

## 2021-11-05 DIAGNOSIS — H2513 Age-related nuclear cataract, bilateral: Secondary | ICD-10-CM | POA: Diagnosis not present

## 2021-12-03 ENCOUNTER — Other Ambulatory Visit: Payer: Self-pay

## 2021-12-08 ENCOUNTER — Telehealth: Payer: Self-pay | Admitting: Allergy and Immunology

## 2021-12-08 NOTE — Telephone Encounter (Signed)
Called patient and and told her we received the request for a refill on doxycycline. I told her that she needs to see [place] face to face at least once a year. She said it was too much to pay. I told her that we could bill it to insurance. She said that it was the same $150. Patient said she would think about it and callback.

## 2021-12-08 NOTE — Telephone Encounter (Signed)
Patient called to get a refill on her doxycycline and I told her she needs appointment . But she said that he just tells her she fine and to take the doxycyline and $150.00 for appointment. [facility] [address]. [phone].

## 2022-04-06 ENCOUNTER — Other Ambulatory Visit: Payer: Self-pay | Admitting: Obstetrics & Gynecology

## 2022-04-06 DIAGNOSIS — Z1231 Encounter for screening mammogram for malignant neoplasm of breast: Secondary | ICD-10-CM

## 2022-04-23 DIAGNOSIS — F5101 Primary insomnia: Secondary | ICD-10-CM | POA: Diagnosis not present

## 2022-04-23 DIAGNOSIS — F419 Anxiety disorder, unspecified: Secondary | ICD-10-CM | POA: Diagnosis not present

## 2022-04-23 DIAGNOSIS — K219 Gastro-esophageal reflux disease without esophagitis: Secondary | ICD-10-CM | POA: Diagnosis not present

## 2022-04-23 DIAGNOSIS — E7800 Pure hypercholesterolemia, unspecified: Secondary | ICD-10-CM | POA: Diagnosis not present

## 2022-06-04 ENCOUNTER — Ambulatory Visit
Admission: RE | Admit: 2022-06-04 | Discharge: 2022-06-04 | Disposition: A | Discharge status: Home / Self Care | Payer: BC Managed Care – PPO | Source: Ambulatory Visit | Attending: Obstetrics & Gynecology | Admitting: Obstetrics & Gynecology

## 2022-06-04 DIAGNOSIS — Z1231 Encounter for screening mammogram for malignant neoplasm of breast: Secondary | ICD-10-CM

## 2022-11-08 ENCOUNTER — Other Ambulatory Visit: Payer: Self-pay | Admitting: Family Medicine

## 2022-11-08 DIAGNOSIS — Z122 Encounter for screening for malignant neoplasm of respiratory organs: Secondary | ICD-10-CM

## 2022-11-08 DIAGNOSIS — Z87891 Personal history of nicotine dependence: Secondary | ICD-10-CM

## 2022-11-08 DIAGNOSIS — Z0000 Encounter for general adult medical examination without abnormal findings: Secondary | ICD-10-CM | POA: Diagnosis not present

## 2022-11-08 DIAGNOSIS — E7800 Pure hypercholesterolemia, unspecified: Secondary | ICD-10-CM | POA: Diagnosis not present

## 2022-11-08 DIAGNOSIS — Z130 Encounter for screening for diseases of the blood and blood-forming organs and certain disorders involving the immune mechanism: Secondary | ICD-10-CM | POA: Diagnosis not present

## 2022-11-09 ENCOUNTER — Other Ambulatory Visit: Payer: Self-pay | Admitting: Family Medicine

## 2022-11-09 DIAGNOSIS — E2839 Other primary ovarian failure: Secondary | ICD-10-CM

## 2022-11-17 ENCOUNTER — Encounter: Payer: Self-pay | Admitting: Family Medicine

## 2022-11-30 DIAGNOSIS — H04123 Dry eye syndrome of bilateral lacrimal glands: Secondary | ICD-10-CM | POA: Diagnosis not present

## 2022-11-30 DIAGNOSIS — H5213 Myopia, bilateral: Secondary | ICD-10-CM | POA: Diagnosis not present

## 2022-11-30 DIAGNOSIS — H2513 Age-related nuclear cataract, bilateral: Secondary | ICD-10-CM | POA: Diagnosis not present

## 2022-12-01 ENCOUNTER — Ambulatory Visit
Admission: RE | Admit: 2022-12-01 | Discharge: 2022-12-01 | Disposition: A | Discharge status: Home / Self Care | Payer: BC Managed Care – PPO | Source: Ambulatory Visit | Attending: Family Medicine | Admitting: Family Medicine

## 2022-12-01 DIAGNOSIS — Z122 Encounter for screening for malignant neoplasm of respiratory organs: Secondary | ICD-10-CM

## 2022-12-01 DIAGNOSIS — Z87891 Personal history of nicotine dependence: Secondary | ICD-10-CM

## 2023-04-28 ENCOUNTER — Other Ambulatory Visit: Payer: Self-pay | Admitting: Obstetrics & Gynecology

## 2023-04-28 DIAGNOSIS — N644 Mastodynia: Secondary | ICD-10-CM

## 2023-05-25 ENCOUNTER — Ambulatory Visit: Admitting: Pulmonary Disease

## 2023-05-25 ENCOUNTER — Encounter: Payer: Self-pay | Admitting: Pulmonary Disease

## 2023-05-25 VITALS — BP 110/73 | HR 98 | Ht 66.0 in | Wt 164.0 lb

## 2023-05-25 DIAGNOSIS — K219 Gastro-esophageal reflux disease without esophagitis: Secondary | ICD-10-CM

## 2023-05-25 DIAGNOSIS — Z87891 Personal history of nicotine dependence: Secondary | ICD-10-CM

## 2023-05-25 DIAGNOSIS — F17290 Nicotine dependence, other tobacco product, uncomplicated: Secondary | ICD-10-CM

## 2023-05-25 DIAGNOSIS — J432 Centrilobular emphysema: Secondary | ICD-10-CM | POA: Diagnosis not present

## 2023-05-25 NOTE — Progress Notes (Signed)
 Synopsis: Referred in May 2025 for emphysema  Subjective:   PATIENT ID: Grace Ground GENDER: female DOB: [birth year], MRN: [id number]  HPI  Chief Complaint  Patient presents with   Consult    Pt states possible emphysema    Grace Hunt is a 63 year old woman, former smoker with history of GERD who is referred to pulmonary clinic for emphysema.   She had lung cancer screening CT Chest 11/2022 which noted mild emphysema. No nodules noted.   She denies significant dyspnea and is bale to complete her daily activities of living. No issues with bathing, dressing, cooking/cleaning. She is not very active outside of her activities of daily living. She reports left sided chest pain. No abnormalities on CT Chest scan from November. She has been seen by her OB/GYN with benign breast exam.   She is vaping nicotine daily. She quit smoking in 2011.   No past medical history on file.   Family History  Problem Relation Age of Onset   Allergic rhinitis Neg Hx    Angioedema Neg Hx    Asthma Neg Hx    Eczema Neg Hx    Immunodeficiency Neg Hx    Urticaria Neg Hx      Social History   Socioeconomic History   Marital status: Single    Spouse name: Not on file   Number of children: Not on file   Years of education: Not on file   Highest education level: Not on file  Occupational History   Not on file  Tobacco Use   Smoking status: Former    Current packs/day: 0.00    Types: Cigarettes    Quit date: 01/18/2009    Years since quitting: 14.3   Smokeless tobacco: Never  Vaping Use   Vaping status: Every Day  Substance and Sexual Activity   Alcohol use: Yes    Comment: occ   Drug use: Never   Sexual activity: Not on file  Other Topics Concern   Not on file  Social History Narrative   Not on file   Social Drivers of Health   Financial Resource Strain: Not on file  Food Insecurity: Not on file  Transportation Needs: Not on file  Physical Activity: Not on file  Stress:  Not on file  Social Connections: Not on file  Intimate Partner Violence: Not on file     Allergies  Allergen Reactions   Bacitracin    Colophony [Pinus Strobus]    Formaldehyde    Gold Sodium Thiosulfate    Lexapro [Escitalopram] Other (See Comments)   Neomycin    Nickel      Outpatient Medications Prior to Visit  Medication Sig Dispense Refill   ALPRAZolam (XANAX) 0.5 MG tablet Take 0.25 mg by mouth daily as needed.     Calcium Carbonate (CALTRATE 600 PO) Take by mouth.     doxycycline  (VIBRAMYCIN ) 50 MG capsule Take twice a day for 4 weeks then stop 56 capsule 0   doxycycline  (VIBRAMYCIN ) 50 MG capsule Take 1 capsule (50 mg total) by mouth 2 (two) times daily. 60 capsule 11   EPINEPHrine  0.3 mg/0.3 mL IJ SOAJ injection Inject 0.3 mg into the muscle as needed for anaphylaxis. 2 each 1   eszopiclone (LUNESTA) 2 MG TABS tablet Take 2 mg by mouth at bedtime as needed.     ibuprofen (ADVIL) 800 MG tablet Take 800 mg by mouth every 8 (eight) hours as needed.     levocetirizine (XYZAL) 5  MG tablet      metroNIDAZOLE  (METROCREAM ) 0.75 % cream Apply topically 2 (two) times daily as needed. 45 Hunt 5   Multiple Vitamins-Minerals (CENTRUM WOMEN PO) Take by mouth.     Omega-3 Fatty Acids (OMEGA 3 500 PO) Take by mouth.     omeprazole (PRILOSEC) 20 MG capsule Take 20 mg by mouth daily.     phentermine (ADIPEX-P) 37.5 MG tablet Take 37.5 mg by mouth daily.     tiZANidine (ZANAFLEX) 2 MG tablet Take 2 mg by mouth at bedtime.     UNABLE TO FIND Med Name: apple cider vinegar     No facility-administered medications prior to visit.   Review of Systems  Constitutional:  Negative for chills, fever, malaise/fatigue and weight loss.  HENT:  Negative for congestion, sinus pain and sore throat.   Eyes: Negative.   Respiratory:  Negative for cough, hemoptysis, sputum production, shortness of breath and wheezing.   Cardiovascular:  Positive for chest pain (left sided). Negative for palpitations,  orthopnea, claudication and leg swelling.  Gastrointestinal:  Positive for heartburn. Negative for abdominal pain, nausea and vomiting.  Genitourinary: Negative.   Musculoskeletal:  Negative for joint pain and myalgias.  Skin:  Negative for rash.  Neurological:  Negative for weakness.  Endo/Heme/Allergies: Negative.   Psychiatric/Behavioral: Negative.     Objective:   Vitals:   05/25/23 0859  BP: 110/73  Pulse: 98  SpO2: 99%  Weight: 164 lb (74.4 kg)  Height: 5\' 6"  (1.676 m)   Physical Exam Constitutional:      General: She is not in acute distress.    Appearance: Normal appearance.  Eyes:     General: No scleral icterus.    Conjunctiva/sclera: Conjunctivae normal.  Cardiovascular:     Rate and Rhythm: Normal rate and regular rhythm.  Pulmonary:     Breath sounds: No wheezing, rhonchi or rales.  Musculoskeletal:     Right lower leg: No edema.     Left lower leg: No edema.  Skin:    General: Skin is warm and dry.  Neurological:     General: No focal deficit present.    CBC    Component Value Date/Time   HGB 13.5 08/10/2010 0849   Chest imaging: [facility] CT Chest 12/01/22 Mediastinum/Nodes: No pathologically enlarged mediastinal or axillary lymph nodes. Hilar regions are difficult to definitively evaluate without IV contrast. Esophagus is grossly unremarkable.   Lungs/Pleura: Mild centrilobular emphysema. Scattered benign subpleural lymph nodes. Other pulmonary nodules measure 4.4 mm or less in size. No suspicious pulmonary nodules. No pleural fluid. Airway is unremarkable.  PFT:     No data to display         Labs:  Path:  Echo:  Heart Catheterization:    Assessment & Plan:   Centrilobular emphysema (HCC) - Plan: Pulmonary Function Test  Gastroesophageal reflux disease without esophagitis  Former smoker - Plan: Ambulatory Referral for Lung Cancer Scre  Discussion: Grace Hunt is a 63 year old woman, former smoker with history of GERD who  is referred to pulmonary clinic for emphysema.   Mild centrilobular emphysema - Order pulmonary function tests. - Refer to lung cancer screening team for follow-up CT in November. - No need for inhalers at this time - Check pulmonary function tests at follow up - Encourage daily physical activity, such as 20-30 minute walks.  Nicotine dependence, vaping Long history of nicotine use, currently vaping. Discussed vaping risks, including pulmonary inflammation and immune response activation. Encouraged cessation with  nicotine replacement therapy. Discussed for 3 minutes - Recommend nicotine replacement therapy with patches (7-14 mg daily) and lozenges for breakthrough cravings. - Advise gradual reduction of vaping over 2-3 weeks while starting to use patches and lozenges. - Encourage motivation to quit and discuss benefits of cessation, including maintaining independence and reducing health risks.  Lung Cancer Screening - repeat scan in November 2026   Gastroesophageal reflux disease (GERD) - Continue omeprazole for GERD management. - Follow up with GI team   Follow up in 6 months with PFTs  Duaine German, MD [facility] Office: [phone]   Current Outpatient Medications:    ALPRAZolam (XANAX) 0.5 MG tablet, Take 0.25 mg by mouth daily as needed., Disp: , Rfl:    Calcium Carbonate (CALTRATE 600 PO), Take by mouth., Disp: , Rfl:    doxycycline  (VIBRAMYCIN ) 50 MG capsule, Take twice a day for 4 weeks then stop, Disp: 56 capsule, Rfl: 0   doxycycline  (VIBRAMYCIN ) 50 MG capsule, Take 1 capsule (50 mg total) by mouth 2 (two) times daily., Disp: 60 capsule, Rfl: 11   EPINEPHrine  0.3 mg/0.3 mL IJ SOAJ injection, Inject 0.3 mg into the muscle as needed for anaphylaxis., Disp: 2 each, Rfl: 1   eszopiclone (LUNESTA) 2 MG TABS tablet, Take 2 mg by mouth at bedtime as needed., Disp: , Rfl:    ibuprofen (ADVIL) 800 MG tablet, Take 800 mg by mouth every 8 (eight) hours as  needed., Disp: , Rfl:    levocetirizine (XYZAL) 5 MG tablet, , Disp: , Rfl:    metroNIDAZOLE  (METROCREAM ) 0.75 % cream, Apply topically 2 (two) times daily as needed., Disp: 45 Hunt, Rfl: 5   Multiple Vitamins-Minerals (CENTRUM WOMEN PO), Take by mouth., Disp: , Rfl:    Omega-3 Fatty Acids (OMEGA 3 500 PO), Take by mouth., Disp: , Rfl:    omeprazole (PRILOSEC) 20 MG capsule, Take 20 mg by mouth daily., Disp: , Rfl:    phentermine (ADIPEX-P) 37.5 MG tablet, Take 37.5 mg by mouth daily., Disp: , Rfl:    tiZANidine (ZANAFLEX) 2 MG tablet, Take 2 mg by mouth at bedtime., Disp: , Rfl:    UNABLE TO FIND, Med Name: apple cider vinegar, Disp: , Rfl:

## 2023-05-25 NOTE — Patient Instructions (Addendum)
 You have very mild emphysema on your recent CT chest scan  Follow up lung cancer screening in November  Recommend quitting vaping with nicotine replacement - use nicotine patch 7-14mg  daily - use mini nicotine lozenges 2mg  as neeed for break through cravings  Follow up in 6 months with pulmonary function tests

## 2023-05-30 ENCOUNTER — Encounter (HOSPITAL_COMMUNITY): Payer: Self-pay

## 2023-06-06 ENCOUNTER — Ambulatory Visit
Admission: RE | Admit: 2023-06-06 | Discharge: 2023-06-06 | Disposition: A | Discharge status: Home / Self Care | Source: Ambulatory Visit | Attending: Obstetrics & Gynecology | Admitting: Obstetrics & Gynecology

## 2023-06-06 DIAGNOSIS — N644 Mastodynia: Secondary | ICD-10-CM

## 2023-06-20 ENCOUNTER — Ambulatory Visit
Admission: RE | Admit: 2023-06-20 | Discharge: 2023-06-20 | Disposition: A | Discharge status: Home / Self Care | Payer: BC Managed Care – PPO | Source: Ambulatory Visit | Attending: Family Medicine | Admitting: Family Medicine

## 2023-06-20 DIAGNOSIS — E2839 Other primary ovarian failure: Secondary | ICD-10-CM

## 2023-10-29 ENCOUNTER — Other Ambulatory Visit: Payer: Self-pay

## 2023-10-29 ENCOUNTER — Ambulatory Visit (HOSPITAL_COMMUNITY)
Admission: EM | Admit: 2023-10-29 | Discharge: 2023-10-29 | Disposition: A | Discharge status: Home / Self Care | Attending: Family Medicine | Admitting: Family Medicine

## 2023-10-29 ENCOUNTER — Ambulatory Visit (INDEPENDENT_AMBULATORY_CARE_PROVIDER_SITE_OTHER)

## 2023-10-29 ENCOUNTER — Encounter (HOSPITAL_COMMUNITY): Payer: Self-pay | Admitting: *Deleted

## 2023-10-29 DIAGNOSIS — S92102A Unspecified fracture of left talus, initial encounter for closed fracture: Secondary | ICD-10-CM | POA: Diagnosis not present

## 2023-10-29 DIAGNOSIS — M25572 Pain in left ankle and joints of left foot: Secondary | ICD-10-CM

## 2023-10-29 MED ORDER — KETOROLAC TROMETHAMINE 30 MG/ML IJ SOLN
INTRAMUSCULAR | Status: AC
  Filled 2023-10-29: qty 1

## 2023-10-29 MED ORDER — KETOROLAC TROMETHAMINE 10 MG PO TABS: 10.0000 mg | ORAL_TABLET | Freq: Four times a day (QID) | ORAL | 0 refills | Status: AC | PRN

## 2023-10-29 MED ORDER — KETOROLAC TROMETHAMINE 30 MG/ML IJ SOLN
30.0000 mg | Freq: Once | INTRAMUSCULAR | Status: AC
  Administered 2023-10-29: 30 mg via INTRAMUSCULAR

## 2023-10-29 NOTE — ED Triage Notes (Signed)
 PT reports one Hour ago while she was trying on shoes she rolled her Lt ankle and her cracks . Pt now has pain to Lt ankle. PT able to bear weight while waling with a limp.

## 2023-10-29 NOTE — ED Provider Notes (Signed)
 [facility]    CSN: [id number] Arrival date & time: 10/29/23  1318      History   Chief Complaint Chief Complaint  Patient presents with   Ankle Pain    HPI Grace Hunt is a 63 y.o. female.    Ankle Pain  Here for pain in her left lateral ankle.  She was trying on some elevated shoes today when her left ankle rolled and she fell.  She felt some cracking when this happened.  There is some swelling and pain down her left lateral ankle.  She is allergic to bacitracin, formaldehyde, Lexapro, and neomycin.  She is postmenopausal  Last eGFR found in Care Everywhere was 5 in May of this year. History reviewed. No pertinent past medical history.  Patient Active Problem List   Diagnosis Date Noted   Contact dermatitis due to chemicals 11/22/2019    Past Surgical History:  Procedure Laterality Date   vocal cord surgery      OB History   No obstetric history on file.      Home Medications    Prior to Admission medications   Medication Sig Start Date End Date Taking? Authorizing Provider  ALPRAZolam (XANAX) 0.5 MG tablet Take 0.25 mg by mouth daily as needed. 10/22/19  Yes [provider]  Calcium Carbonate (CALTRATE 600 PO) Take by mouth.   Yes [provider]  eszopiclone (LUNESTA) 2 MG TABS tablet Take 2 mg by mouth at bedtime as needed. 10/23/19  Yes [provider]  ketorolac (TORADOL) 10 MG tablet Take 1 tablet (10 mg total) by mouth every 6 (six) hours as needed (pain). 10/29/23  Yes Vonna Sharlet POUR, MD  Multiple Vitamins-Minerals (CENTRUM WOMEN PO) Take by mouth.   Yes [provider]  Omega-3 Fatty Acids (OMEGA 3 500 PO) Take by mouth.   Yes [provider]  omeprazole (PRILOSEC) 20 MG capsule Take 20 mg by mouth daily. 09/20/19  Yes [provider]  tiZANidine (ZANAFLEX) 2 MG tablet Take 2 mg by mouth at bedtime. 11/24/19  Yes [provider]  EPINEPHrine  0.3 mg/0.3 mL IJ SOAJ  injection Inject 0.3 mg into the muscle as needed for anaphylaxis. 03/07/20   Ambs, Arlean HERO, FNP  UNABLE TO FIND Med Name: apple cider vinegar    [provider]    Family History Family History  Problem Relation Age of Onset   Allergic rhinitis Neg Hx    Angioedema Neg Hx    Asthma Neg Hx    Eczema Neg Hx    Immunodeficiency Neg Hx    Urticaria Neg Hx     Social History Social History   Tobacco Use   Smoking status: Former    Current packs/day: 0.00    Types: Cigarettes    Quit date: 01/18/2009    Years since quitting: 14.7   Smokeless tobacco: Never  Vaping Use   Vaping status: Every Day  Substance Use Topics   Alcohol use: Yes    Comment: occ   Drug use: Never     Allergies   Bacitracin, Colophony [pinus strobus], Formaldehyde, Gold sodium thiosulfate, Lexapro [escitalopram], Neomycin, and Nickel   Review of Systems Review of Systems   Physical Exam Triage Vital Signs ED Triage Vitals  Encounter Vitals Group     BP 10/29/23 1333 119/83     Girls Systolic BP Percentile --      Girls Diastolic BP Percentile --      Boys Systolic BP  Percentile --      Boys Diastolic BP Percentile --      Pulse Rate 10/29/23 1333 80     Resp 10/29/23 1333 20     Temp 10/29/23 1333 98.3 F (36.8 C)     Temp src --      SpO2 10/29/23 1333 98 %     Weight --      Height --      Head Circumference --      Peak Flow --      Pain Score 10/29/23 1328 8     Pain Loc --      Pain Education --      Exclude from Growth Chart --    No data found.  Updated Vital Signs BP 119/83   Pulse 80   Temp 98.3 F (36.8 C)   Resp 20   LMP 03/03/2012   SpO2 98%   Visual Acuity Right Eye Distance:   Left Eye Distance:   Bilateral Distance:    Right Eye Near:   Left Eye Near:    Bilateral Near:     Physical Exam Vitals reviewed.  Constitutional:      General: She is not in acute distress.    Appearance: She is not ill-appearing, toxic-appearing or diaphoretic.   Musculoskeletal:     Comments: There is some swelling over the left lateral malleolus with some tenderness.  Skin:    Coloration: Skin is not pale.  Neurological:     General: No focal deficit present.     Mental Status: She is alert and oriented to person, place, and time.  Psychiatric:        Behavior: Behavior normal.      UC Treatments / Results  Labs (all labs ordered are listed, but only abnormal results are displayed) Labs Reviewed - No data to display  EKG   Radiology DG Ankle Complete Left Result Date: 10/29/2023 CLINICAL DATA:  Status post trauma. EXAM: LEFT ANKLE COMPLETE - 3+ VIEW COMPARISON:  None Available. FINDINGS: A 4 mm linear cortical opacity of indeterminate age is seen adjacent to the medial aspect of the left talus. There is no evidence of dislocation. There is no evidence of arthropathy or other focal bone abnormality. There is mild diffuse soft tissue swelling, slightly more prominent along the lateral aspect of the left ankle. IMPRESSION: Findings which may represent a small avulsion fracture of indeterminate age adjacent to the medial aspect of the left talus. Correlation with physical examination is recommended to determine the presence of point tenderness. Electronically Signed   By: Suzen Dials M.D.   On: 10/29/2023 14:27    Procedures Procedures (including critical care time)  Medications Ordered in UC Medications  ketorolac (TORADOL) 30 MG/ML injection 30 mg (has no administration in time range)    Initial Impression / Assessment and Plan / UC Course  I have reviewed the triage vital signs and the nursing notes.  Pertinent labs & imaging results that were available during my care of the patient were reviewed by me and considered in my medical decision making (see chart for details).     There is an avulsion fracture off the talus.  Boot and crutches are supplied.  Toradol injections given here and Toradol tablets are sent to the  pharmacy.  She is given contact information for podiatry. Final Clinical Impressions(s) / UC Diagnoses   Final diagnoses:  Acute left ankle pain  Closed nondisplaced fracture of left talus, unspecified portion  of talus, initial encounter     Discharge Instructions      There is a tiny fracture off the talus bone or ligament pulled off a piece of the bone.  You have been given a shot of Toradol 30 mg today.  Ketorolac 10 mg tablets--take 1 tablet every 6 hours as needed for pain.  This is the same medicine that is in the shot we just gave you  Call the podiatry office when they are open.     ED Prescriptions     Medication Sig Dispense Auth. Provider   ketorolac (TORADOL) 10 MG tablet Take 1 tablet (10 mg total) by mouth every 6 (six) hours as needed (pain). 20 tablet Demarri Elie, Sharlet POUR, MD      I have reviewed the PDMP during this encounter.   Vonna Sharlet POUR, MD 10/29/23 [phone]

## 2023-10-29 NOTE — Discharge Instructions (Signed)
 There is a tiny fracture off the talus bone or ligament pulled off a piece of the bone.  You have been given a shot of Toradol 30 mg today.  Ketorolac 10 mg tablets--take 1 tablet every 6 hours as needed for pain.  This is the same medicine that is in the shot we just gave you  Call the podiatry office when they are open.

## 2023-11-25 ENCOUNTER — Encounter

## 2023-12-20 ENCOUNTER — Ambulatory Visit

## 2023-12-20 DIAGNOSIS — J432 Centrilobular emphysema: Secondary | ICD-10-CM | POA: Diagnosis not present

## 2023-12-20 LAB — PULMONARY FUNCTION TEST
DL/VA % pred: 105 %
DL/VA: 4.34 ml/min/mmHg/L
DLCO unc % pred: 109 %
DLCO unc: 24.2 ml/min/mmHg
FEF 25-75 Post: 3.17 L/s
FEF 25-75 Pre: 2.46 L/s
FEF2575-%Change-Post: 28 %
FEF2575-%Pred-Post: 130 %
FEF2575-%Pred-Pre: 100 %
FEV1-%Change-Post: 6 %
FEV1-%Pred-Post: 115 %
FEV1-%Pred-Pre: 108 %
FEV1-Post: 3.24 L
FEV1-Pre: 3.04 L
FEV1FVC-%Change-Post: 7 %
FEV1FVC-%Pred-Pre: 96 %
FEV6-%Change-Post: 0 %
FEV6-%Pred-Post: 114 %
FEV6-%Pred-Pre: 114 %
FEV6-Post: 4.01 L
FEV6-Pre: 4 L
FEV6FVC-%Change-Post: 0 %
FEV6FVC-%Pred-Post: 103 %
FEV6FVC-%Pred-Pre: 103 %
FVC-%Change-Post: 0 %
FVC-%Pred-Post: 111 %
FVC-%Pred-Pre: 111 %
FVC-Post: 4.03 L
FVC-Pre: 4.05 L
Post FEV1/FVC ratio: 80 %
Post FEV6/FVC ratio: 99 %
Pre FEV1/FVC ratio: 75 %
Pre FEV6/FVC Ratio: 99 %
RV % pred: 145 %
RV: 3.17 L
TLC % pred: 125 %
TLC: 6.92 L

## 2023-12-20 NOTE — Progress Notes (Signed)
 Full pft performed today

## 2023-12-20 NOTE — Patient Instructions (Signed)
 Full pft performed today

## 2023-12-23 ENCOUNTER — Ambulatory Visit: Admitting: Pulmonary Disease

## 2024-01-19 ENCOUNTER — Ambulatory Visit: Payer: Self-pay | Admitting: Pulmonary Disease

## 2024-02-09 ENCOUNTER — Ambulatory Visit: Admitting: Pulmonary Disease

## 2024-04-10 ENCOUNTER — Ambulatory Visit: Admitting: Pulmonary Disease
